# Patient Record
Sex: Female | Born: 1935
Health system: Southern US, Community
[De-identification: ages and names within clinical notes are randomized; demographics above are authoritative.]

## PROBLEM LIST (undated history)

## (undated) DIAGNOSIS — I10 Essential (primary) hypertension: Secondary | ICD-10-CM

## (undated) HISTORY — PX: ABDOMINAL HYSTERECTOMY: SHX81

## (undated) HISTORY — PX: CHOLECYSTECTOMY: SHX55

---

## 2005-10-27 ENCOUNTER — Ambulatory Visit: Payer: Self-pay | Admitting: Unknown Physician Specialty

## 2006-03-27 ENCOUNTER — Ambulatory Visit: Payer: Self-pay | Admitting: Unknown Physician Specialty

## 2006-07-05 ENCOUNTER — Ambulatory Visit: Payer: Self-pay | Admitting: Psychiatry

## 2006-12-06 ENCOUNTER — Ambulatory Visit: Payer: Self-pay | Admitting: Unknown Physician Specialty

## 2006-12-13 ENCOUNTER — Ambulatory Visit: Payer: Self-pay | Admitting: Unknown Physician Specialty

## 2007-12-21 ENCOUNTER — Ambulatory Visit: Payer: Self-pay | Admitting: Unknown Physician Specialty

## 2009-01-12 ENCOUNTER — Ambulatory Visit: Payer: Self-pay | Admitting: Unknown Physician Specialty

## 2010-01-18 ENCOUNTER — Ambulatory Visit: Payer: Self-pay | Admitting: Unknown Physician Specialty

## 2011-05-25 ENCOUNTER — Ambulatory Visit: Payer: Self-pay | Admitting: Unknown Physician Specialty

## 2011-12-16 ENCOUNTER — Emergency Department: Payer: Self-pay | Admitting: Internal Medicine

## 2012-06-06 ENCOUNTER — Observation Stay: Payer: Self-pay | Admitting: Student

## 2012-06-06 LAB — COMPREHENSIVE METABOLIC PANEL
Albumin: 4 g/dL (ref 3.4–5.0)
Bilirubin,Total: 0.5 mg/dL (ref 0.2–1.0)
Calcium, Total: 9.6 mg/dL (ref 8.5–10.1)
Chloride: 103 mmol/L (ref 98–107)
Co2: 30 mmol/L (ref 21–32)
EGFR (African American): >60
EGFR (Non-African Amer.): 56 — ABNORMAL LOW
SGOT(AST): 30 U/L (ref 15–37)
SGPT (ALT): 24 U/L (ref 12–78)
Sodium: 141 mmol/L (ref 136–145)

## 2012-06-06 LAB — CK TOTAL AND CKMB (NOT AT ARMC)
CK, Total: 135 U/L (ref 21–215)
CK-MB: 0.8 ng/mL (ref 0.5–3.6)

## 2012-06-06 LAB — TROPONIN I: Troponin-I: <0.02 ng/mL

## 2012-06-06 LAB — CBC
HCT: 37.5 % (ref 35.0–47.0)
HGB: 12.1 g/dL (ref 12.0–16.0)
MCHC: 32.3 g/dL (ref 32.0–36.0)
MCV: 84 fL (ref 80–100)

## 2012-06-07 LAB — CK TOTAL AND CKMB (NOT AT ARMC)
CK, Total: 102 U/L (ref 21–215)
CK, Total: 89 U/L (ref 21–215)
CK-MB: 0.7 ng/mL (ref 0.5–3.6)

## 2012-06-07 LAB — TROPONIN I
Troponin-I: <0.02 ng/mL
Troponin-I: <0.02 ng/mL

## 2012-12-18 ENCOUNTER — Ambulatory Visit: Payer: Self-pay | Admitting: Unknown Physician Specialty

## 2014-12-23 NOTE — Discharge Summary (Signed)
PATIENT NAME:  [name], [name] DATE OF BIRTH:  06/30/1930  DATE OF ADMISSION:  06/06/2012 DATE OF DISCHARGE:  06/07/2012   PRIMARY CARE PHYSICIAN: Dr. Hyacinth MeekerMiller  CHIEF COMPLAINT: Chest pain.   DISCHARGE DIAGNOSES:  1. Chest pain, likely stress induced and very atypical.  2. Hypertension.  3. Nausea and vomiting in the setting of narcotic use.  4. Gastroesophageal reflux disease. 5. Dementia.  6. Osteoarthritis.   DISCHARGE MEDICATIONS:  1. One A Day 50 Plus 1 tab daily.  2. Hydrochlorothiazide 25 mg daily.  3. Aspirin 81 mg daily.  4. Donepezil 10 mg at bedtime.  5. Omeprazole 20 mg two times a day.  6. Hair, skin, and nails 1 tab daily. 7. Amlodipine 5 mg once a day  8. Calcium 600 with D 600 mg/200 international units 1 tab 2 times a day.   DIET: Low sodium.   ACTIVITY: As tolerated.   FOLLOW-UP: Please follow with your primary care physician within 1 to 2 weeks.   DISPOSITION: Home.   HISTORY OF PRESENT ILLNESS/HOSPITAL COURSE: For full details of the history and physical, please see the dictation on 06/06/2012 by Dr. Cherlynn KaiserSainani. Briefly, this is a pleasant 81 year old African American female who presented with bilateral shoulder pain and then chest pain across her chest while driving. She has been undergoing some stress recently. Here in the ER she did receive some morphine and benzos. She did develop some nausea and vomiting. She was admitted for observation to the hospitalist service. She did not have any acute ST elevations or depressions on the EKG. She underwent cyclic cardiac markers which were all negative and today she has no symptoms of chest pain, shortness of breath, palpitations, and her GI symptoms which likely were in the setting of narcotics she received in the ER have been resolved and she is tolerating a diet. At this point given the very atypical symptoms of the pain and likely relation to anxiety she was experiencing in the setting of her move and  other factors, we would discharge her with outpatient follow-up with primary care physician.   TOTAL TIME SPENT: 35 minutes.   CODE STATUS: The patient is a FULL CODE.   ____________________________ Krystal EatonShayiq Athalene Kolle, MD sa:drc D: 06/07/2012 09:59:15 ET T: 06/07/2012 13:07:29 ET JOB#: [id number]  cc: Krystal EatonShayiq Debby Clyne, MD, <Dictator> Yetta FlockAileen H. Miller, MD Krystal EatonSHAYIQ Luisangel Wainright MD ELECTRONICALLY SIGNED 06/08/2012 15:12

## 2014-12-23 NOTE — H&P (Signed)
PATIENT NAME:  [name], [name] MR#:  [id number] DATE OF BIRTH:  February 19, 1930  DATE OF ADMISSION:  06/06/2012  PRIMARY CARE PHYSICIAN: Silver Huguenin, MD   CHIEF COMPLAINT: Shoulder pain and chest pain.   HISTORY OF PRESENT ILLNESS: The patient is an 81 year old female who presents to the Emergency Room with significant bilateral shoulder and chest pain that began earlier this afternoon while she was driving. She also claims that she developed these two knots on both of her clavicular areas which were quite painful but after putting some ice on it they resolved. She still continued to have some shoulder pain radiating down the left side and, therefore, was a bit concerned and came to the ER for further evaluation. In the Emergency Room, the patient was having some significant pain in her chest and her shoulder area, received a total of 6 mg of morphine with some alleviation of her symptoms. Given the progressive nature of her symptoms and having chest pain, hospitalist service was contacted for treatment and evaluation. The patient did complain of some shortness of breath along with the chest pain but no nausea, no vomiting, no diaphoresis, no palpitations, no syncope, and no dizziness. She has never had these symptoms before. As per the family on further questioning, the patient has been under a significant amount of stress in the past week or so.   REVIEW OF SYSTEMS: CONSTITUTIONAL: No documented fever. No weight gain, no weight loss. EYES: No blurry or double vision. ENT: No tinnitus. No postnasal drip. No redness of the oropharynx. RESPIRATORY: No cough, no wheeze, no hemoptysis. CARDIOVASCULAR: Positive chest pain. No orthopnea, no palpitations, no syncope. GI: No nausea, no vomiting, no diarrhea, no abdominal pain, no melena, no hematochezia. GU: No dysuria, no hematuria. ENDOCRINE: No polyuria or nocturia. No heat or cold intolerance. HEME: No anemia, no bruising, no bleeding. INTEGUMENTARY: No rashes,  no lesions. MUSCULOSKELETAL: No arthritis, no swelling, no gout. NEUROLOGIC: No numbness, no tingling, no ataxia, no seizure-type activity. PSYCH: No anxiety, no insomnia, no ADD.   PAST MEDICAL HISTORY:  1. Dementia. 2. Hypertension. 3. Osteoarthritis. 4. Gastroesophageal reflux disease. 5. Hypertension.   ALLERGIES: No known drug allergies.   SOCIAL HISTORY: No smoking. No alcohol abuse. No illicit drug abuse. Lives at home by herself.   FAMILY HISTORY: The patient cannot recall her family history.   CURRENT MEDICATIONS:  1. Amlodipine 5 mg daily.  2. Aspirin 81 mg daily.  3. Calcium with Vitamin D 1 tab b.i.d.  4. Aricept 10 mg daily.  5. Hydrochlorothiazide 25 mg daily.  6. Omeprazole 20 mg b.i.d.  7. Multivitamin daily.   PHYSICAL EXAMINATION ON ADMISSION:   VITAL SIGNS: Temperature 97.3, pulse 66, respirations 18, blood pressure 114/58, sats 96% on 2 liters nasal cannula.   GENERAL: She is a lethargic appearing female, mildly nauseated.   HEENT: Atraumatic, normocephalic. Extraocular muscles are intact. Pupils are pinpoint but reactive.   NECK: Supple. No jugular venous distention, no bruits, no lymphadenopathy, no thyromegaly. There is no oropharyngeal erythema.   HEART: Regular rate and rhythm. No murmurs, no rubs, no clicks.   LUNGS: Clear to auscultation bilaterally. No rales, no rhonchi, no wheezes.   ABDOMEN: Soft, flat, nontender, nondistended. Has good bowel sounds. No hepatosplenomegaly appreciated.   EXTREMITIES: No evidence of any cyanosis, clubbing, or peripheral edema. Has +2 pedal and radial pulses bilaterally.   NEUROLOGICAL: The patient is alert and oriented x3 with no focal motor or sensory deficits appreciated bilaterally.  SKIN: Moist and warm with no rash appreciated.   LYMPHATIC: There is no cervical or axillary lymphadenopathy.   LABORATORY, DIAGNOSTIC, AND RADIOLOGICAL DATA: Serum glucose 88, BUN 18, creatinine 0.9, sodium 141, potassium  3.3, chloride 103, bicarb 30. LFTs are within normal limits. Troponin less than 0.02. White cell count 12.1, hemoglobin 12.1, hematocrit 37.5, platelet count 206.   The patient did have a chest x-ray done which showed hypoinflated lung fields but no evidence of any acute cardiopulmonary disease.   The patient also underwent a CT scan of the chest done with contrast which showed no evidence of acute pulmonary embolism, mild enlargement of the cardiac chambers without evidence of CHF, emphysematous changes bilaterally, bibasilar atelectasis or early infiltrate. No pulmonary parenchymal masses or nodules appreciated. No mediastinal or hilar lymphadenopathy.   ASSESSMENT AND PLAN: This is an 81 year old female with history of hypertension, gastroesophageal reflux disease, osteoarthritis, and dementia who presents to the hospital with bilateral shoulder and chest pain.  1. Chest pain. The patient has very atypical symptoms for angina. Most likely her symptoms are related to anxiety. I will observe the patient overnight on telemetry. Follow serial cardiac markers. Continue aspirin. P.r.n. nitroglycerin. Avoid giving her narcotics or sedatives as she is quite somnolent presently related to the narcotics and Ativan she got in the ER. The patient's EKG has been reviewed initially which shows normal sinus rhythm with no acute ST or T wave changes.  2. Nausea and vomiting. This is likely related to the narcotics she received in the ER. Therefore, avoid giving her any morphine or Percocet for now. Continue supportive care with antiemetics for now.  3. Gastroesophageal reflux disease. Continue Prilosec.  4. Hypertension. Continue Norvasc and hydrochlorothiazide.  5. Dementia. Continue Aricept.   CODE STATUS: The patient is a FULL CODE.   TIME SPENT: 40 minutes.   ____________________________ Rolly PancakeVivek J. Cherlynn KaiserSainani, MD vjs:drc D: 06/06/2012 19:55:30 ET T: 06/07/2012 07:27:18 ET JOB#: [id number]  cc: Rolly PancakeVivek J.  Cherlynn KaiserSainani, MD, <Dictator> Yetta FlockAileen H. Miller, MD Houston SirenVIVEK J SAINANI MD ELECTRONICALLY SIGNED 06/07/2012 14:21

## 2015-05-29 DIAGNOSIS — K219 Gastro-esophageal reflux disease without esophagitis: Secondary | ICD-10-CM | POA: Diagnosis not present

## 2015-05-29 DIAGNOSIS — M19041 Primary osteoarthritis, right hand: Secondary | ICD-10-CM | POA: Diagnosis not present

## 2015-05-29 DIAGNOSIS — Z23 Encounter for immunization: Secondary | ICD-10-CM | POA: Diagnosis not present

## 2015-05-29 DIAGNOSIS — I10 Essential (primary) hypertension: Secondary | ICD-10-CM | POA: Diagnosis not present

## 2015-05-29 DIAGNOSIS — M160 Bilateral primary osteoarthritis of hip: Secondary | ICD-10-CM | POA: Diagnosis not present

## 2015-05-29 DIAGNOSIS — M19042 Primary osteoarthritis, left hand: Secondary | ICD-10-CM | POA: Diagnosis not present

## 2015-06-11 DIAGNOSIS — Z961 Presence of intraocular lens: Secondary | ICD-10-CM | POA: Diagnosis not present

## 2015-08-27 DIAGNOSIS — G301 Alzheimer's disease with late onset: Secondary | ICD-10-CM | POA: Diagnosis not present

## 2015-08-27 DIAGNOSIS — Z6841 Body Mass Index (BMI) 40.0 and over, adult: Secondary | ICD-10-CM | POA: Diagnosis not present

## 2015-08-27 DIAGNOSIS — I10 Essential (primary) hypertension: Secondary | ICD-10-CM | POA: Diagnosis not present

## 2015-08-27 DIAGNOSIS — M170 Bilateral primary osteoarthritis of knee: Secondary | ICD-10-CM | POA: Diagnosis not present

## 2015-08-27 DIAGNOSIS — K219 Gastro-esophageal reflux disease without esophagitis: Secondary | ICD-10-CM | POA: Diagnosis not present

## 2015-08-27 DIAGNOSIS — M79672 Pain in left foot: Secondary | ICD-10-CM | POA: Diagnosis not present

## 2015-08-27 DIAGNOSIS — F0280 Dementia in other diseases classified elsewhere without behavioral disturbance: Secondary | ICD-10-CM | POA: Diagnosis not present

## 2015-09-30 DIAGNOSIS — M2042 Other hammer toe(s) (acquired), left foot: Secondary | ICD-10-CM | POA: Diagnosis not present

## 2015-09-30 DIAGNOSIS — M898X9 Other specified disorders of bone, unspecified site: Secondary | ICD-10-CM | POA: Diagnosis not present

## 2015-11-07 ENCOUNTER — Encounter: Payer: Self-pay | Admitting: *Deleted

## 2015-11-07 ENCOUNTER — Ambulatory Visit
Admission: EM | Admit: 2015-11-07 | Discharge: 2015-11-07 | Disposition: A | Discharge status: Home / Self Care | Payer: Commercial Managed Care - HMO | Attending: Family Medicine | Admitting: Family Medicine

## 2015-11-07 ENCOUNTER — Ambulatory Visit (INDEPENDENT_AMBULATORY_CARE_PROVIDER_SITE_OTHER): Payer: Commercial Managed Care - HMO

## 2015-11-07 DIAGNOSIS — M10069 Idiopathic gout, unspecified knee: Secondary | ICD-10-CM

## 2015-11-07 DIAGNOSIS — M109 Gout, unspecified: Secondary | ICD-10-CM

## 2015-11-07 DIAGNOSIS — M179 Osteoarthritis of knee, unspecified: Secondary | ICD-10-CM | POA: Diagnosis not present

## 2015-11-07 HISTORY — DX: Essential (primary) hypertension: I10

## 2015-11-07 LAB — URIC ACID: Uric Acid, Serum: 7.6 mg/dL — ABNORMAL HIGH (ref 2.3–6.6)

## 2015-11-07 MED ORDER — INDOMETHACIN 25 MG PO CAPS: 25.0000 mg | ORAL_CAPSULE | Freq: Three times a day (TID) | ORAL | Status: DC | PRN

## 2015-11-07 NOTE — ED Provider Notes (Signed)
CSN: [id number]     Arrival date & time 11/07/15  1043 History   First MD Initiated Contact with Patient 11/07/15 1156     Chief Complaint  Patient presents with  . Knee Pain   (Consider location/radiation/quality/duration/timing/severity/associated sxs/prior Treatment) HPI: Patient presents today with symptoms of left knee pain. Patient states that her symptoms started 4 days ago. She denies any trauma or injury to the knee. She does have some history of arthritis in her hands but has not had any symptoms in her knees before. She denies any clicking or locking of the knee. She denies any fevers. She denies any history of gout. She denies any recent alcohol use or eating any red meat. She has eaten fish. She is on hydrochlorothiazide which she has been on for years.  Past Medical History  Diagnosis Date  . Hypertension    Past Surgical History  Procedure Laterality Date  . Abdominal hysterectomy    . Cholecystectomy     No family history on file. Social History  Substance Use Topics  . Smoking status: Never Smoker   . Smokeless tobacco: None  . Alcohol Use: No   OB History    No data available     Review of Systems: Negative except mentioned above.   Allergies  Review of patient's allergies indicates no known allergies.  Home Medications   Prior to Admission medications   Medication Sig Start Date End Date Taking? Authorizing Provider  donepezil (ARICEPT) 10 MG tablet Take 10 mg by mouth at bedtime.   Yes Historical Provider, MD  hydrochlorothiazide (HYDRODIURIL) 25 MG tablet Take 25 mg by mouth daily.   Yes Historical Provider, MD  Multiple Vitamin (MULTIVITAMIN) capsule Take 1 capsule by mouth daily.   Yes Historical Provider, MD  omeprazole (PRILOSEC) 20 MG capsule Take 20 mg by mouth 2 (two) times daily before a meal.   Yes Historical Provider, MD  potassium chloride (K-DUR) 10 MEQ tablet Take 20 mEq by mouth 2 (two) times daily.    Yes Historical Provider, MD   Meds  Ordered and Administered this Visit  Medications - No data to display  BP 143/58 mmHg  Pulse 61  Temp(Src) 96.9 F (36.1 C) (Tympanic)  Ht 5\' 5"  (1.651 m)  Wt 270 lb (122.471 kg)  BMI 44.93 kg/m2  SpO2 98% No data found.   Physical Exam   GENERAL: NAD RESP: CTA B CARD: RRR MSK: L Knee: mild warmth, no erythema, ROM limited due to pain, no instability appreciated, no medial or lateral jointline tenderness, -Homans  NEURO: CN II-XII grossly intact   ED Course  Procedures (including critical care time)  Labs Review Labs Reviewed - No data to display  Imaging Review No results found.     MDM   A/P: Left Knee Pain- likely related to gout, uric acid slightly elevated, x-rays show degenerative changes, will prescribe patient Indocin, encourage patient to review handout regarding gout that was given to patient, I would recommend that patient follow up with her primary care physician regarding this. Would not stop hydrochlorothiazide at this time since this is the first gout attack for the patient. Encouraged staying hydrated. If frequent bouts may want to consider. Seek medical attention if any further issues.  Jolene ProvostKirtida Evann Koelzer, MD 11/07/15 1310

## 2015-11-07 NOTE — ED Notes (Signed)
Pt states that she has left knee pain, no known injury, started 4 days ago.

## 2015-12-02 DIAGNOSIS — F0280 Dementia in other diseases classified elsewhere without behavioral disturbance: Secondary | ICD-10-CM | POA: Diagnosis not present

## 2015-12-02 DIAGNOSIS — I10 Essential (primary) hypertension: Secondary | ICD-10-CM | POA: Diagnosis not present

## 2015-12-02 DIAGNOSIS — M1712 Unilateral primary osteoarthritis, left knee: Secondary | ICD-10-CM | POA: Diagnosis not present

## 2015-12-02 DIAGNOSIS — R220 Localized swelling, mass and lump, head: Secondary | ICD-10-CM | POA: Diagnosis not present

## 2015-12-02 DIAGNOSIS — G301 Alzheimer's disease with late onset: Secondary | ICD-10-CM | POA: Diagnosis not present

## 2016-01-27 DIAGNOSIS — M7989 Other specified soft tissue disorders: Secondary | ICD-10-CM | POA: Diagnosis not present

## 2016-01-27 DIAGNOSIS — M79643 Pain in unspecified hand: Secondary | ICD-10-CM | POA: Diagnosis not present

## 2016-01-27 DIAGNOSIS — G301 Alzheimer's disease with late onset: Secondary | ICD-10-CM | POA: Diagnosis not present

## 2016-01-27 DIAGNOSIS — F0280 Dementia in other diseases classified elsewhere without behavioral disturbance: Secondary | ICD-10-CM | POA: Diagnosis not present

## 2016-01-27 DIAGNOSIS — M79645 Pain in left finger(s): Secondary | ICD-10-CM | POA: Diagnosis not present

## 2016-01-27 DIAGNOSIS — I10 Essential (primary) hypertension: Secondary | ICD-10-CM | POA: Diagnosis not present

## 2016-01-27 DIAGNOSIS — Z6841 Body Mass Index (BMI) 40.0 and over, adult: Secondary | ICD-10-CM | POA: Diagnosis not present

## 2016-03-11 DIAGNOSIS — I10 Essential (primary) hypertension: Secondary | ICD-10-CM | POA: Diagnosis not present

## 2016-03-11 DIAGNOSIS — M25562 Pain in left knee: Secondary | ICD-10-CM | POA: Diagnosis not present

## 2016-03-11 DIAGNOSIS — M25472 Effusion, left ankle: Secondary | ICD-10-CM | POA: Diagnosis not present

## 2016-03-11 DIAGNOSIS — Z131 Encounter for screening for diabetes mellitus: Secondary | ICD-10-CM | POA: Diagnosis not present

## 2016-03-11 DIAGNOSIS — M179 Osteoarthritis of knee, unspecified: Secondary | ICD-10-CM | POA: Diagnosis not present

## 2016-03-11 DIAGNOSIS — M7989 Other specified soft tissue disorders: Secondary | ICD-10-CM | POA: Diagnosis not present

## 2016-06-01 DIAGNOSIS — M1000 Idiopathic gout, unspecified site: Secondary | ICD-10-CM | POA: Diagnosis not present

## 2016-06-01 DIAGNOSIS — M1712 Unilateral primary osteoarthritis, left knee: Secondary | ICD-10-CM | POA: Diagnosis not present

## 2016-06-01 DIAGNOSIS — Z23 Encounter for immunization: Secondary | ICD-10-CM | POA: Diagnosis not present

## 2017-01-17 DIAGNOSIS — Z131 Encounter for screening for diabetes mellitus: Secondary | ICD-10-CM | POA: Diagnosis not present

## 2017-01-17 DIAGNOSIS — F0280 Dementia in other diseases classified elsewhere without behavioral disturbance: Secondary | ICD-10-CM | POA: Diagnosis not present

## 2017-01-17 DIAGNOSIS — K219 Gastro-esophageal reflux disease without esophagitis: Secondary | ICD-10-CM | POA: Diagnosis not present

## 2017-01-17 DIAGNOSIS — I10 Essential (primary) hypertension: Secondary | ICD-10-CM | POA: Diagnosis not present

## 2017-01-17 DIAGNOSIS — M1000 Idiopathic gout, unspecified site: Secondary | ICD-10-CM | POA: Diagnosis not present

## 2017-01-17 DIAGNOSIS — G301 Alzheimer's disease with late onset: Secondary | ICD-10-CM | POA: Diagnosis not present

## 2017-04-19 DIAGNOSIS — Z131 Encounter for screening for diabetes mellitus: Secondary | ICD-10-CM | POA: Diagnosis not present

## 2017-04-19 DIAGNOSIS — M1000 Idiopathic gout, unspecified site: Secondary | ICD-10-CM | POA: Diagnosis not present

## 2017-04-19 DIAGNOSIS — I10 Essential (primary) hypertension: Secondary | ICD-10-CM | POA: Diagnosis not present

## 2017-08-22 DIAGNOSIS — Z780 Asymptomatic menopausal state: Secondary | ICD-10-CM | POA: Diagnosis not present

## 2017-08-22 DIAGNOSIS — Z0000 Encounter for general adult medical examination without abnormal findings: Secondary | ICD-10-CM | POA: Diagnosis not present

## 2017-08-22 DIAGNOSIS — Z1231 Encounter for screening mammogram for malignant neoplasm of breast: Secondary | ICD-10-CM | POA: Diagnosis not present

## 2017-08-22 DIAGNOSIS — Z23 Encounter for immunization: Secondary | ICD-10-CM | POA: Diagnosis not present

## 2017-08-22 DIAGNOSIS — I10 Essential (primary) hypertension: Secondary | ICD-10-CM | POA: Diagnosis not present

## 2017-09-21 DIAGNOSIS — Z780 Asymptomatic menopausal state: Secondary | ICD-10-CM | POA: Diagnosis not present

## 2017-11-23 DIAGNOSIS — K219 Gastro-esophageal reflux disease without esophagitis: Secondary | ICD-10-CM | POA: Diagnosis not present

## 2017-11-23 DIAGNOSIS — F0280 Dementia in other diseases classified elsewhere without behavioral disturbance: Secondary | ICD-10-CM | POA: Diagnosis not present

## 2017-11-23 DIAGNOSIS — F411 Generalized anxiety disorder: Secondary | ICD-10-CM | POA: Diagnosis not present

## 2017-11-23 DIAGNOSIS — I10 Essential (primary) hypertension: Secondary | ICD-10-CM | POA: Diagnosis not present

## 2017-11-23 DIAGNOSIS — M1000 Idiopathic gout, unspecified site: Secondary | ICD-10-CM | POA: Diagnosis not present

## 2017-11-23 DIAGNOSIS — G301 Alzheimer's disease with late onset: Secondary | ICD-10-CM | POA: Diagnosis not present

## 2018-01-18 DIAGNOSIS — L200 Besnier's prurigo: Secondary | ICD-10-CM | POA: Diagnosis not present

## 2018-03-26 DIAGNOSIS — I10 Essential (primary) hypertension: Secondary | ICD-10-CM | POA: Diagnosis not present

## 2018-03-26 DIAGNOSIS — M542 Cervicalgia: Secondary | ICD-10-CM | POA: Diagnosis not present

## 2018-03-26 DIAGNOSIS — Z6841 Body Mass Index (BMI) 40.0 and over, adult: Secondary | ICD-10-CM | POA: Diagnosis not present

## 2018-03-26 DIAGNOSIS — M1000 Idiopathic gout, unspecified site: Secondary | ICD-10-CM | POA: Diagnosis not present

## 2018-05-21 DIAGNOSIS — R05 Cough: Secondary | ICD-10-CM | POA: Diagnosis not present

## 2018-06-20 DIAGNOSIS — I10 Essential (primary) hypertension: Secondary | ICD-10-CM | POA: Diagnosis not present

## 2018-06-20 DIAGNOSIS — M1000 Idiopathic gout, unspecified site: Secondary | ICD-10-CM | POA: Diagnosis not present

## 2018-06-27 DIAGNOSIS — F0280 Dementia in other diseases classified elsewhere without behavioral disturbance: Secondary | ICD-10-CM | POA: Diagnosis not present

## 2018-06-27 DIAGNOSIS — Z23 Encounter for immunization: Secondary | ICD-10-CM | POA: Diagnosis not present

## 2018-06-27 DIAGNOSIS — G301 Alzheimer's disease with late onset: Secondary | ICD-10-CM | POA: Diagnosis not present

## 2018-06-27 DIAGNOSIS — Z0000 Encounter for general adult medical examination without abnormal findings: Secondary | ICD-10-CM | POA: Diagnosis not present

## 2018-06-27 DIAGNOSIS — L299 Pruritus, unspecified: Secondary | ICD-10-CM | POA: Diagnosis not present

## 2018-09-02 ENCOUNTER — Encounter: Payer: Self-pay | Admitting: Emergency Medicine

## 2018-09-02 ENCOUNTER — Other Ambulatory Visit: Payer: Self-pay

## 2018-09-02 ENCOUNTER — Ambulatory Visit
Admission: EM | Admit: 2018-09-02 | Discharge: 2018-09-02 | Disposition: A | Discharge status: Home / Self Care | Payer: Medicare HMO | Attending: Student | Admitting: Student

## 2018-09-02 DIAGNOSIS — R1084 Generalized abdominal pain: Secondary | ICD-10-CM | POA: Insufficient documentation

## 2018-09-02 MED ORDER — CELECOXIB 50 MG PO CAPS: 50.0000 mg | ORAL_CAPSULE | Freq: Two times a day (BID) | ORAL | 0 refills | Status: DC

## 2018-09-02 MED ORDER — SUCRALFATE 1 G PO TABS: 1.0000 g | ORAL_TABLET | Freq: Two times a day (BID) | ORAL | 0 refills | Status: DC

## 2018-09-02 NOTE — ED Triage Notes (Signed)
Patient c/o lower abdominal/groin pain that started 3 weeks ago. She states anything she has to do that causes her to strain is painful. Denies fever, nausea, vomiting and diarrhea. Denies urinary symptoms. She states she has had a hernia before.

## 2018-09-02 NOTE — Discharge Instructions (Signed)
-  Take medications as directed. -Follow-up with PCP to discuss issue if pain continues.

## 2018-09-03 NOTE — ED Provider Notes (Signed)
[facility]    CSN: [id number] Arrival date & time: 09/02/18  1300     History   Chief Complaint Chief Complaint  Patient presents with  . Abdominal Pain    HPI [name] is a 84 y.o. female who presents today for evaluation of abdominal pain ongoing for 3 weeks.  The patient denies any injury or trauma, she states that any activity that causes her to put a "strain" on her abdomen causing her discomfort.  She states that when she gets up from a sitting to standing position she experiences pain.  When she is lying down and not moving the pain resolves.  She does not think that any food cause her discomfort, she cannot tell me if eating hurts or helps the discomfort.  She is concerned that she might have developed an ulcer, she also states that in the past she has been diagnosed with a hernia.  Denies any nausea, vomiting or diarrhea.  No bloody stools.  HPI  Past Medical History:  Diagnosis Date  . Hypertension     There are no active problems to display for this patient.   Past Surgical History:  Procedure Laterality Date  . ABDOMINAL HYSTERECTOMY    . CHOLECYSTECTOMY      OB History   No obstetric history on file.      Home Medications    Prior to Admission medications   Medication Sig Start Date End Date Taking? Authorizing Provider  donepezil (ARICEPT) 10 MG tablet Take 10 mg by mouth at bedtime.   Yes [provider]  hydrochlorothiazide (HYDRODIURIL) 25 MG tablet Take 25 mg by mouth daily.   Yes [provider]  indomethacin (INDOCIN) 25 MG capsule Take 1 capsule (25 mg total) by mouth 3 (three) times daily as needed. 11/07/15  Yes Jolene Provost, MD  Multiple Vitamin (MULTIVITAMIN) capsule Take 1 capsule by mouth daily.   Yes [provider]  omeprazole (PRILOSEC) 20 MG capsule Take 20 mg by mouth 2 (two) times daily before a meal.   Yes [provider]  potassium chloride (K-DUR) 10 MEQ tablet Take 20 mEq  by mouth 2 (two) times daily.    Yes [provider]  celecoxib (CELEBREX) 50 MG capsule Take 1 capsule (50 mg total) by mouth 2 (two) times daily. 09/02/18   Anson Oregon, PA-C  sucralfate (CARAFATE) 1 g tablet Take 1 tablet (1 g total) by mouth 2 (two) times daily. 09/02/18   Anson [place], PA-C    Family History History reviewed. No pertinent family history.  Social History Social History   Tobacco Use  . Smoking status: Never Smoker  . Smokeless tobacco: Never Used  Substance Use Topics  . Alcohol use: No  . Drug use: No     Allergies   Ace inhibitors   Review of Systems Review of Systems  Constitutional: Negative for appetite change and fever.  HENT: Negative.   Eyes: Negative.   Respiratory: Negative.   Cardiovascular: Negative.   Gastrointestinal: Positive for abdominal pain. Negative for blood in stool, diarrhea, nausea, rectal pain and vomiting.  All other systems reviewed and are negative.  Physical Exam Triage Vital Signs ED Triage Vitals  Enc Vitals Group     BP 09/02/18 1419 (!) 180/81     Pulse Rate 09/02/18 1419 (!) 57     Resp 09/02/18 1419 18     Temp 09/02/18 1419 97.8 F (36.6 C)     Temp  Source 09/02/18 1419 Oral     SpO2 09/02/18 1419 98 %     Weight 09/02/18 1417 250 lb (113.4 kg)     Height 09/02/18 1417 5\' 4"  (1.626 m)     Head Circumference --      Peak Flow --      Pain Score 09/02/18 1416 9     Pain Loc --      Pain Edu? --      Excl. in GC? --    No data found.  Updated Vital Signs BP (!) 180/81 (BP Location: Right Arm)   Pulse (!) 57   Temp 97.8 F (36.6 C) (Oral)   Resp 18   Ht 5\' 4"  (1.626 m)   Wt 250 lb (113.4 kg)   SpO2 98%   BMI 42.91 kg/m   Visual Acuity Right Eye Distance:   Left Eye Distance:   Bilateral Distance:    Right Eye Near:   Left Eye Near:    Bilateral Near:     Physical Exam Constitutional:      General: She is not in acute distress.    Appearance: She is not  ill-appearing.  Cardiovascular:     Rate and Rhythm: Normal rate and regular rhythm.     Heart sounds: No murmur. No friction rub. No gallop.   Pulmonary:     Effort: Pulmonary effort is normal.     Breath sounds: Normal breath sounds. No stridor. No wheezing or rhonchi.  Abdominal:     General: Bowel sounds are normal. There is no distension or abdominal bruit.     Palpations: Abdomen is soft. There is no shifting dullness, fluid wave, hepatomegaly, splenomegaly or mass.     Tenderness: There is abdominal tenderness in the left upper quadrant. There is no right CVA tenderness or rebound. Negative signs include Murphy's sign, McBurney's sign and obturator sign.     Comments: Due to the body habitus I was unable to palpate a hernia.  Neurological:     Mental Status: She is alert.    UC Treatments / Results  Labs (all labs ordered are listed, but only abnormal results are displayed) Labs Reviewed - No data to display  EKG None  Radiology No results found.  Procedures Procedures (including critical care time)  Medications Ordered in UC Medications - No data to display  Initial Impression / Assessment and Plan / UC Course  I have reviewed the triage vital signs and the nursing notes.  Pertinent labs & imaging results that were available during my care of the patient were reviewed by me and considered in my medical decision making (see chart for details).     1.  Options were discussed today with the patient. 2.  The way that the patient describes her pain it does sound muscular in nature due to the increased discomfort with mobility and activities.  Due to the patient's body habitus I was unable to palpate hernia.  The patient was given a prescription of Celebrex to take for discomfort. 3.  The patient is concerned that she may have an ulcer, instructed the patient that her symptoms do not match what I anticipate a patient presenting with ulcer.  She states that she would like  to be prescribed something that would help if she does indeed have an ulcer, she was given a one-time supply of Carafate.  Instructed to follow-up with her primary care physician to discuss continued discomfort. 4.  Follow-up if symptoms worsen or  fail to improve. Final Clinical Impressions(s) / UC Diagnoses   Final diagnoses:  Generalized abdominal pain     Discharge Instructions     -Take medications as directed. -Follow-up with PCP to discuss issue if pain continues.    ED Prescriptions    Medication Sig Dispense Auth. Provider   sucralfate (CARAFATE) 1 g tablet Take 1 tablet (1 g total) by mouth 2 (two) times daily. 60 tablet Anson OregonMcGhee, Rayquon Uselman Lance, [place]PA-C   celecoxib (CELEBREX) 50 MG capsule Take 1 capsule (50 mg total) by mouth 2 (two) times daily. 60 capsule Anson OregonMcGhee, Damier Disano Lance, PA-C     Controlled Substance Prescriptions [place] Controlled Substance Registry consulted? Not Applicable   Anson OregonMcGhee, Yvett Rossel Lance, PA-C 09/03/18 1010

## 2018-09-28 ENCOUNTER — Ambulatory Visit
Admission: RE | Admit: 2018-09-28 | Discharge: 2018-09-28 | Disposition: A | Discharge status: Home / Self Care | Payer: Medicare HMO | Source: Ambulatory Visit | Attending: Internal Medicine | Admitting: Internal Medicine

## 2018-09-28 ENCOUNTER — Other Ambulatory Visit: Payer: Self-pay | Admitting: Internal Medicine

## 2018-09-28 DIAGNOSIS — K449 Diaphragmatic hernia without obstruction or gangrene: Secondary | ICD-10-CM | POA: Diagnosis not present

## 2018-09-28 DIAGNOSIS — R197 Diarrhea, unspecified: Secondary | ICD-10-CM | POA: Diagnosis not present

## 2018-09-28 DIAGNOSIS — R1032 Left lower quadrant pain: Secondary | ICD-10-CM | POA: Diagnosis not present

## 2018-09-28 DIAGNOSIS — L304 Erythema intertrigo: Secondary | ICD-10-CM | POA: Diagnosis not present

## 2018-09-28 DIAGNOSIS — R1013 Epigastric pain: Secondary | ICD-10-CM | POA: Diagnosis not present

## 2018-09-28 MED ORDER — IOHEXOL 300 MG/ML  SOLN
100.0000 mL | Freq: Once | INTRAMUSCULAR | Status: AC | PRN
  Administered 2018-09-28: 100 mL via INTRAVENOUS

## 2018-10-03 DIAGNOSIS — R197 Diarrhea, unspecified: Secondary | ICD-10-CM | POA: Diagnosis not present

## 2018-10-08 DIAGNOSIS — R1032 Left lower quadrant pain: Secondary | ICD-10-CM | POA: Diagnosis not present

## 2018-11-07 DIAGNOSIS — I10 Essential (primary) hypertension: Secondary | ICD-10-CM | POA: Diagnosis not present

## 2018-11-07 DIAGNOSIS — R1030 Lower abdominal pain, unspecified: Secondary | ICD-10-CM | POA: Diagnosis not present

## 2019-03-14 DIAGNOSIS — L304 Erythema intertrigo: Secondary | ICD-10-CM | POA: Diagnosis not present

## 2019-03-14 DIAGNOSIS — Z6841 Body Mass Index (BMI) 40.0 and over, adult: Secondary | ICD-10-CM | POA: Diagnosis not present

## 2019-03-14 DIAGNOSIS — Z7689 Persons encountering health services in other specified circumstances: Secondary | ICD-10-CM | POA: Diagnosis not present

## 2019-03-14 DIAGNOSIS — K219 Gastro-esophageal reflux disease without esophagitis: Secondary | ICD-10-CM | POA: Diagnosis not present

## 2019-03-14 DIAGNOSIS — G301 Alzheimer's disease with late onset: Secondary | ICD-10-CM | POA: Diagnosis not present

## 2019-03-14 DIAGNOSIS — I10 Essential (primary) hypertension: Secondary | ICD-10-CM | POA: Diagnosis not present

## 2019-03-14 DIAGNOSIS — M159 Polyosteoarthritis, unspecified: Secondary | ICD-10-CM | POA: Diagnosis not present

## 2019-03-14 DIAGNOSIS — Z0000 Encounter for general adult medical examination without abnormal findings: Secondary | ICD-10-CM | POA: Diagnosis not present

## 2019-03-14 DIAGNOSIS — D649 Anemia, unspecified: Secondary | ICD-10-CM | POA: Diagnosis not present

## 2019-05-02 DIAGNOSIS — R69 Illness, unspecified: Secondary | ICD-10-CM | POA: Diagnosis not present

## 2019-07-10 DIAGNOSIS — G301 Alzheimer's disease with late onset: Secondary | ICD-10-CM | POA: Diagnosis not present

## 2019-07-10 DIAGNOSIS — R7309 Other abnormal glucose: Secondary | ICD-10-CM | POA: Diagnosis not present

## 2019-07-10 DIAGNOSIS — E538 Deficiency of other specified B group vitamins: Secondary | ICD-10-CM | POA: Diagnosis not present

## 2019-07-10 DIAGNOSIS — K219 Gastro-esophageal reflux disease without esophagitis: Secondary | ICD-10-CM | POA: Diagnosis not present

## 2019-07-10 DIAGNOSIS — L304 Erythema intertrigo: Secondary | ICD-10-CM | POA: Diagnosis not present

## 2019-07-10 DIAGNOSIS — M159 Polyosteoarthritis, unspecified: Secondary | ICD-10-CM | POA: Diagnosis not present

## 2019-07-10 DIAGNOSIS — I10 Essential (primary) hypertension: Secondary | ICD-10-CM | POA: Diagnosis not present

## 2019-07-10 DIAGNOSIS — Z6841 Body Mass Index (BMI) 40.0 and over, adult: Secondary | ICD-10-CM | POA: Diagnosis not present

## 2019-07-10 DIAGNOSIS — D649 Anemia, unspecified: Secondary | ICD-10-CM | POA: Diagnosis not present

## 2019-07-17 DIAGNOSIS — Z79899 Other long term (current) drug therapy: Secondary | ICD-10-CM | POA: Diagnosis not present

## 2019-07-17 DIAGNOSIS — Z8739 Personal history of other diseases of the musculoskeletal system and connective tissue: Secondary | ICD-10-CM | POA: Diagnosis not present

## 2019-07-17 DIAGNOSIS — I10 Essential (primary) hypertension: Secondary | ICD-10-CM | POA: Diagnosis not present

## 2019-07-17 DIAGNOSIS — M8949 Other hypertrophic osteoarthropathy, multiple sites: Secondary | ICD-10-CM | POA: Diagnosis not present

## 2019-07-17 DIAGNOSIS — R1030 Lower abdominal pain, unspecified: Secondary | ICD-10-CM | POA: Diagnosis not present

## 2019-08-06 DIAGNOSIS — Z23 Encounter for immunization: Secondary | ICD-10-CM | POA: Diagnosis not present

## 2019-11-07 DIAGNOSIS — R1030 Lower abdominal pain, unspecified: Secondary | ICD-10-CM | POA: Diagnosis not present

## 2019-11-07 DIAGNOSIS — Z8739 Personal history of other diseases of the musculoskeletal system and connective tissue: Secondary | ICD-10-CM | POA: Diagnosis not present

## 2019-11-07 DIAGNOSIS — Z6839 Body mass index (BMI) 39.0-39.9, adult: Secondary | ICD-10-CM | POA: Diagnosis not present

## 2019-11-07 DIAGNOSIS — M8949 Other hypertrophic osteoarthropathy, multiple sites: Secondary | ICD-10-CM | POA: Diagnosis not present

## 2019-11-07 DIAGNOSIS — I10 Essential (primary) hypertension: Secondary | ICD-10-CM | POA: Diagnosis not present

## 2019-11-14 DIAGNOSIS — M170 Bilateral primary osteoarthritis of knee: Secondary | ICD-10-CM | POA: Diagnosis not present

## 2019-11-14 DIAGNOSIS — R600 Localized edema: Secondary | ICD-10-CM | POA: Diagnosis not present

## 2019-11-14 DIAGNOSIS — Z6841 Body Mass Index (BMI) 40.0 and over, adult: Secondary | ICD-10-CM | POA: Diagnosis not present

## 2019-11-14 DIAGNOSIS — I10 Essential (primary) hypertension: Secondary | ICD-10-CM | POA: Diagnosis not present

## 2019-11-14 DIAGNOSIS — K219 Gastro-esophageal reflux disease without esophagitis: Secondary | ICD-10-CM | POA: Diagnosis not present

## 2019-11-14 DIAGNOSIS — Z0000 Encounter for general adult medical examination without abnormal findings: Secondary | ICD-10-CM | POA: Diagnosis not present

## 2019-11-14 DIAGNOSIS — F0150 Vascular dementia without behavioral disturbance: Secondary | ICD-10-CM | POA: Diagnosis not present

## 2019-11-14 DIAGNOSIS — M160 Bilateral primary osteoarthritis of hip: Secondary | ICD-10-CM | POA: Diagnosis not present

## 2019-11-14 DIAGNOSIS — M109 Gout, unspecified: Secondary | ICD-10-CM | POA: Diagnosis not present

## 2019-12-15 DIAGNOSIS — M169 Osteoarthritis of hip, unspecified: Secondary | ICD-10-CM | POA: Diagnosis not present

## 2019-12-15 DIAGNOSIS — R262 Difficulty in walking, not elsewhere classified: Secondary | ICD-10-CM | POA: Diagnosis not present

## 2020-03-13 DIAGNOSIS — F0280 Dementia in other diseases classified elsewhere without behavioral disturbance: Secondary | ICD-10-CM | POA: Diagnosis not present

## 2020-03-13 DIAGNOSIS — Z6841 Body Mass Index (BMI) 40.0 and over, adult: Secondary | ICD-10-CM | POA: Diagnosis not present

## 2020-03-13 DIAGNOSIS — R1030 Lower abdominal pain, unspecified: Secondary | ICD-10-CM | POA: Diagnosis not present

## 2020-03-13 DIAGNOSIS — I10 Essential (primary) hypertension: Secondary | ICD-10-CM | POA: Diagnosis not present

## 2020-03-13 DIAGNOSIS — M160 Bilateral primary osteoarthritis of hip: Secondary | ICD-10-CM | POA: Diagnosis not present

## 2020-03-13 DIAGNOSIS — G301 Alzheimer's disease with late onset: Secondary | ICD-10-CM | POA: Diagnosis not present

## 2020-03-13 DIAGNOSIS — K219 Gastro-esophageal reflux disease without esophagitis: Secondary | ICD-10-CM | POA: Diagnosis not present

## 2020-03-13 DIAGNOSIS — L304 Erythema intertrigo: Secondary | ICD-10-CM | POA: Diagnosis not present

## 2020-03-13 DIAGNOSIS — M109 Gout, unspecified: Secondary | ICD-10-CM | POA: Diagnosis not present

## 2020-03-20 DIAGNOSIS — Z79899 Other long term (current) drug therapy: Secondary | ICD-10-CM | POA: Diagnosis not present

## 2020-03-20 DIAGNOSIS — Z0000 Encounter for general adult medical examination without abnormal findings: Secondary | ICD-10-CM | POA: Diagnosis not present

## 2020-03-20 DIAGNOSIS — I10 Essential (primary) hypertension: Secondary | ICD-10-CM | POA: Diagnosis not present

## 2020-03-20 DIAGNOSIS — F0150 Vascular dementia without behavioral disturbance: Secondary | ICD-10-CM | POA: Diagnosis not present

## 2020-03-20 DIAGNOSIS — D649 Anemia, unspecified: Secondary | ICD-10-CM | POA: Diagnosis not present

## 2020-06-04 DIAGNOSIS — R195 Other fecal abnormalities: Secondary | ICD-10-CM | POA: Diagnosis not present

## 2020-06-04 DIAGNOSIS — Z23 Encounter for immunization: Secondary | ICD-10-CM | POA: Diagnosis not present

## 2020-06-04 DIAGNOSIS — I739 Peripheral vascular disease, unspecified: Secondary | ICD-10-CM | POA: Diagnosis not present

## 2020-06-04 DIAGNOSIS — K219 Gastro-esophageal reflux disease without esophagitis: Secondary | ICD-10-CM | POA: Diagnosis not present

## 2020-06-04 DIAGNOSIS — M1611 Unilateral primary osteoarthritis, right hip: Secondary | ICD-10-CM | POA: Diagnosis not present

## 2020-06-04 DIAGNOSIS — I10 Essential (primary) hypertension: Secondary | ICD-10-CM | POA: Diagnosis not present

## 2020-06-04 DIAGNOSIS — F0150 Vascular dementia without behavioral disturbance: Secondary | ICD-10-CM | POA: Diagnosis not present

## 2020-06-04 DIAGNOSIS — R1031 Right lower quadrant pain: Secondary | ICD-10-CM | POA: Diagnosis not present

## 2020-06-04 DIAGNOSIS — M47816 Spondylosis without myelopathy or radiculopathy, lumbar region: Secondary | ICD-10-CM | POA: Diagnosis not present

## 2020-06-04 DIAGNOSIS — M109 Gout, unspecified: Secondary | ICD-10-CM | POA: Diagnosis not present

## 2020-09-28 DIAGNOSIS — M792 Neuralgia and neuritis, unspecified: Secondary | ICD-10-CM | POA: Diagnosis not present

## 2020-09-28 DIAGNOSIS — R1031 Right lower quadrant pain: Secondary | ICD-10-CM | POA: Diagnosis not present

## 2020-09-28 DIAGNOSIS — G301 Alzheimer's disease with late onset: Secondary | ICD-10-CM | POA: Diagnosis not present

## 2020-09-28 DIAGNOSIS — I10 Essential (primary) hypertension: Secondary | ICD-10-CM | POA: Diagnosis not present

## 2020-09-28 DIAGNOSIS — M109 Gout, unspecified: Secondary | ICD-10-CM | POA: Diagnosis not present

## 2020-09-28 DIAGNOSIS — F0280 Dementia in other diseases classified elsewhere without behavioral disturbance: Secondary | ICD-10-CM | POA: Diagnosis not present

## 2020-09-28 DIAGNOSIS — K219 Gastro-esophageal reflux disease without esophagitis: Secondary | ICD-10-CM | POA: Diagnosis not present

## 2020-09-28 DIAGNOSIS — R195 Other fecal abnormalities: Secondary | ICD-10-CM | POA: Diagnosis not present

## 2020-10-05 DIAGNOSIS — R195 Other fecal abnormalities: Secondary | ICD-10-CM | POA: Diagnosis not present

## 2020-10-05 DIAGNOSIS — Z79899 Other long term (current) drug therapy: Secondary | ICD-10-CM | POA: Diagnosis not present

## 2020-10-05 DIAGNOSIS — F0150 Vascular dementia without behavioral disturbance: Secondary | ICD-10-CM | POA: Diagnosis not present

## 2020-10-05 DIAGNOSIS — M160 Bilateral primary osteoarthritis of hip: Secondary | ICD-10-CM | POA: Diagnosis not present

## 2020-10-05 DIAGNOSIS — Z6841 Body Mass Index (BMI) 40.0 and over, adult: Secondary | ICD-10-CM | POA: Diagnosis not present

## 2020-10-05 DIAGNOSIS — I10 Essential (primary) hypertension: Secondary | ICD-10-CM | POA: Diagnosis not present

## 2020-12-01 DIAGNOSIS — M2042 Other hammer toe(s) (acquired), left foot: Secondary | ICD-10-CM | POA: Diagnosis not present

## 2020-12-24 DIAGNOSIS — Z7185 Encounter for immunization safety counseling: Secondary | ICD-10-CM | POA: Diagnosis not present

## 2020-12-24 DIAGNOSIS — Z23 Encounter for immunization: Secondary | ICD-10-CM | POA: Diagnosis not present

## 2020-12-28 DIAGNOSIS — M160 Bilateral primary osteoarthritis of hip: Secondary | ICD-10-CM | POA: Diagnosis not present

## 2020-12-28 DIAGNOSIS — G301 Alzheimer's disease with late onset: Secondary | ICD-10-CM | POA: Diagnosis not present

## 2020-12-28 DIAGNOSIS — Z6841 Body Mass Index (BMI) 40.0 and over, adult: Secondary | ICD-10-CM | POA: Diagnosis not present

## 2020-12-28 DIAGNOSIS — R195 Other fecal abnormalities: Secondary | ICD-10-CM | POA: Diagnosis not present

## 2020-12-28 DIAGNOSIS — I10 Essential (primary) hypertension: Secondary | ICD-10-CM | POA: Diagnosis not present

## 2020-12-28 DIAGNOSIS — F0280 Dementia in other diseases classified elsewhere without behavioral disturbance: Secondary | ICD-10-CM | POA: Diagnosis not present

## 2021-01-04 DIAGNOSIS — Z0000 Encounter for general adult medical examination without abnormal findings: Secondary | ICD-10-CM | POA: Diagnosis not present

## 2021-01-04 DIAGNOSIS — Z79899 Other long term (current) drug therapy: Secondary | ICD-10-CM | POA: Diagnosis not present

## 2021-01-04 DIAGNOSIS — M159 Polyosteoarthritis, unspecified: Secondary | ICD-10-CM | POA: Diagnosis not present

## 2021-01-04 DIAGNOSIS — Z7689 Persons encountering health services in other specified circumstances: Secondary | ICD-10-CM | POA: Diagnosis not present

## 2021-01-04 DIAGNOSIS — I10 Essential (primary) hypertension: Secondary | ICD-10-CM | POA: Diagnosis not present

## 2021-01-04 DIAGNOSIS — F0150 Vascular dementia without behavioral disturbance: Secondary | ICD-10-CM | POA: Diagnosis not present

## 2021-01-04 DIAGNOSIS — M19042 Primary osteoarthritis, left hand: Secondary | ICD-10-CM | POA: Diagnosis not present

## 2021-01-04 DIAGNOSIS — M19041 Primary osteoarthritis, right hand: Secondary | ICD-10-CM | POA: Diagnosis not present

## 2021-01-09 ENCOUNTER — Other Ambulatory Visit: Payer: Self-pay

## 2021-01-09 ENCOUNTER — Emergency Department: Payer: Medicare HMO

## 2021-01-09 ENCOUNTER — Emergency Department
Admission: EM | Admit: 2021-01-09 | Discharge: 2021-01-09 | Disposition: A | Discharge status: Home / Self Care | Payer: Medicare HMO | Attending: Emergency Medicine | Admitting: Emergency Medicine

## 2021-01-09 DIAGNOSIS — M4710 Other spondylosis with myelopathy, site unspecified: Secondary | ICD-10-CM

## 2021-01-09 DIAGNOSIS — I10 Essential (primary) hypertension: Secondary | ICD-10-CM | POA: Diagnosis not present

## 2021-01-09 DIAGNOSIS — R0902 Hypoxemia: Secondary | ICD-10-CM | POA: Diagnosis not present

## 2021-01-09 DIAGNOSIS — Z79899 Other long term (current) drug therapy: Secondary | ICD-10-CM | POA: Diagnosis not present

## 2021-01-09 DIAGNOSIS — M545 Low back pain, unspecified: Secondary | ICD-10-CM | POA: Diagnosis not present

## 2021-01-09 DIAGNOSIS — M549 Dorsalgia, unspecified: Secondary | ICD-10-CM | POA: Diagnosis not present

## 2021-01-09 DIAGNOSIS — M4716 Other spondylosis with myelopathy, lumbar region: Secondary | ICD-10-CM | POA: Diagnosis not present

## 2021-01-09 MED ORDER — LIDOCAINE 5 % EX PTCH: 1.0000 | MEDICATED_PATCH | Freq: Two times a day (BID) | CUTANEOUS | 0 refills | Status: DC

## 2021-01-09 MED ORDER — HYDROMORPHONE HCL 1 MG/ML IJ SOLN
0.5000 mg | Freq: Once | INTRAMUSCULAR | Status: AC
  Administered 2021-01-09: 0.5 mg via INTRAVENOUS
  Filled 2021-01-09: qty 1

## 2021-01-09 NOTE — ED Notes (Addendum)
First nurse note: Pt comes EMS with severe lower back pain. Has arthritis. Got worse while sitting at her computer. Screaming in pain, refusing to get in wheelchair. Placed in recliner at this time. VSS.

## 2021-01-09 NOTE — ED Provider Notes (Signed)
[facility] Emergency Department Provider Note   ____________________________________________   Event Date/Time   First MD Initiated Contact with Patient 01/09/21 1708     (approximate)  I have reviewed the triage vital signs and the nursing notes.   HISTORY  Chief Complaint Back Pain    HPI [name] is a 87 y.o. female patient state awaken with acute low back pain and had difficulty getting out of bed.  Patient stated pain is in the lower part of her back.  Patient states she think is arthritis.  Patient  pain normally worsens when it rains.  Patient denies radicular component to her back pain.  Patient denies bladder or bowel dysfunction.  Patient pain increased with weightbearing.  Patient state heavy reliance on her cane today.  Patient stated pain so bad had to activate her alert device and arrived via EMS.  Patient describes the pain as "sharp".  Rates pain as a 3/10.  Denies fall or any other provocative incident for complaint      Past Medical History:  Diagnosis Date  . Hypertension     There are no problems to display for this patient.   Past Surgical History:  Procedure Laterality Date  . ABDOMINAL HYSTERECTOMY    . CHOLECYSTECTOMY      Prior to Admission medications   Medication Sig Start Date End Date Taking? Authorizing Provider  lidocaine (LIDODERM) 5 % Place 1 patch onto the skin every 12 (twelve) hours. Remove & Discard patch within 12 hours or as directed by MD 01/09/21 01/09/22 Yes Joni Reining, PA-C  celecoxib (CELEBREX) 50 MG capsule Take 1 capsule (50 mg total) by mouth 2 (two) times daily. 09/02/18   Anson Oregon, PA-C  donepezil (ARICEPT) 10 MG tablet Take 10 mg by mouth at bedtime.    [provider]  hydrochlorothiazide (HYDRODIURIL) 25 MG tablet Take 25 mg by mouth daily.    [provider]  indomethacin (INDOCIN) 25 MG capsule Take 1 capsule (25 mg total) by mouth 3 (three) times daily  as needed. 11/07/15   Jolene Provost, MD  Multiple Vitamin (MULTIVITAMIN) capsule Take 1 capsule by mouth daily.    [provider]  omeprazole (PRILOSEC) 20 MG capsule Take 20 mg by mouth 2 (two) times daily before a meal.    [provider]  potassium chloride (K-DUR) 10 MEQ tablet Take 20 mEq by mouth 2 (two) times daily.     [provider]  sucralfate (CARAFATE) 1 g tablet Take 1 tablet (1 g total) by mouth 2 (two) times daily. 09/02/18   Anson Oregon, PA-C    Allergies Ace inhibitors  No family history on file.  Social History Social History   Tobacco Use  . Smoking status: Never Smoker  . Smokeless tobacco: Never Used  Substance Use Topics  . Alcohol use: No  . Drug use: No    Review of Systems Mild dementia constitutional: No fever/chills Eyes: No visual changes. ENT: No sore throat. Cardiovascular: Denies chest pain. Respiratory: Denies shortness of breath. Gastrointestinal: No abdominal pain.  No nausea, no vomiting.  No diarrhea.  No constipation. Genitourinary: Negative for dysuria. Musculoskeletal: Positive for back pain. Skin: Negative for rash. Neurological: Negative for headaches, focal weakness or numbness. Psychiatric:  Mild dementia Endocrine:  Hypertension Allergic/Immunilogical: ACE inhibitors. ____________________________________________   PHYSICAL EXAM:  VITAL SIGNS: ED Triage Vitals  Enc Vitals Group     BP 01/09/21 1644 (!) 157/60  Pulse Rate 01/09/21 1644 60     Resp 01/09/21 1644 18     Temp 01/09/21 1644 98.2 F (36.8 C)     Temp Source 01/09/21 1644 Oral     SpO2 01/09/21 1644 97 %     Weight 01/09/21 1642 230 lb (104.3 kg)     Height 01/09/21 1642 5\' 4"  (1.626 m)     Head Circumference --      Peak Flow --      Pain Score 01/09/21 1642 3     Pain Loc --      Pain Edu? --      Excl. in GC? --    Constitutional: Alert and oriented. Well appearing and in no acute distress. Eyes: Conjunctivae  are normal. PERRL. EOMI. Head: Atraumatic. Nose: No congestion/rhinnorhea. Mouth/Throat: Mucous membranes are moist.  Oropharynx non-erythematous. Neck: No stridor.  cervical spine tenderness to palpation. Hematological/Lymphatic/Immunilogical: No cervical lymphadenopathy. Cardiovascular: Normal rate, regular rhythm. Grossly normal heart sounds.  Good peripheral circulation.  Elevated blood pressure. Respiratory: Normal respiratory effort.  No retractions. Lungs CTAB. Gastrointestinal: Soft and nontender. No distention. No abdominal bruits. No CVA tenderness. Genitourinary: Deferred Musculoskeletal: Patient has moderate scoliosis of thoracic and lumbar spine.  Patient is moderate guarding with palpation of the spine.  Moderate degenerative changes from anterior spurs from L2-L5.  And lumbar spine.  Patient has decreased range of motion is all fields.  Unable to perform straight leg test because the patient is very uncomfortable with any touch and of the lower extremities of her body.   Neurologic:  Normal speech and language. No gross focal neurologic deficits are appreciated. No gait instability. Skin:  Skin is warm, dry and intact. No rash noted. Psychiatric: Mood and affect are normal. Speech and behavior are normal.  ____________________________________________   LABS (all labs ordered are listed, but only abnormal results are displayed)  Labs Reviewed - No data to display ____________________________________________  EKG   ____________________________________________  RADIOLOGY I, 03/11/21, personally viewed and evaluated these images (plain radiographs) as part of my medical decision making, as well as reviewing the written report by the radiologist.  ED MD interpretation: Patient has moderate degree of scoliosis of the thoracic spine.  Patient has moderate degenerative changes with anterior spurs from L to through L5.  No acute findings. Official radiology report(s): DG  Lumbar Spine 2-3 Views  Result Date: 01/09/2021 CLINICAL DATA:  Acute onset low back pain this morning. EXAM: LUMBAR SPINE - 2-3 VIEW COMPARISON:  Abdomen and pelvis CT dated 09/28/2018 FINDINGS: Five non-rib-bearing lumbar vertebrae. Stable mild to moderate levoconvex thoracolumbar rotary scoliosis. Stable extensive facet degenerative changes at multiple levels with associated grade 1 anterolisthesis at the L4-5 level. Mild to moderate anterior spur formation at multiple levels. No fractures or pars defects. No acute subluxations. IMPRESSION: Stable scoliosis and degenerative changes.  No acute abnormality. Electronically Signed   By: 09/30/2018 M.D.   On: 01/09/2021 18:45    ____________________________________________   PROCEDURES  Procedure(s) performed (including Critical Care):  Procedures   ____________________________________________   INITIAL IMPRESSION / ASSESSMENT AND PLAN / ED COURSE  As part of my medical decision making, I reviewed the following data within the electronic MEDICAL RECORD NUMBER        Patient presented with a.m. awakening of acute low back pain.  Patient history of chronic back pain secondary to arthritis.  Patient stated pain increased secondary to weather change.  Patient denies radicular component there is no  bladder or bowel dysfunction.  Discussed x-ray findings with patient which is consistent for history of moderate scoliosis of the lumbar spine and moderate degenerative change of the lumbar spine.  Patient responded well to 0.5 mg of Dilaudid.  Patient given discharge care instruction a prescription for Lidoderm patches.  Patient advised to continue to take extra strength Tylenol.  Patient given a walker to assist with ambulation.  Follow-up with PCP.      ____________________________________________   FINAL CLINICAL IMPRESSION(S) / ED DIAGNOSES  Final diagnoses:  Osteoarthritis of spine with myelopathy, unspecified spinal region     ED  Discharge Orders         Ordered    lidocaine (LIDODERM) 5 %  Every 12 hours        01/09/21 1840          *Please note:  [name] was evaluated in Emergency Department on 01/09/2021 for the symptoms described in the history of present illness. She was evaluated in the context of the global COVID-19 pandemic, which necessitated consideration that the patient might be at risk for infection with the SARS-CoV-2 virus that causes COVID-19. Institutional protocols and algorithms that pertain to the evaluation of patients at risk for COVID-19 are in a state of rapid change based on information released by regulatory bodies including the CDC and federal and state organizations. These policies and algorithms were followed during the patient's care in the ED.  Some ED evaluations and interventions may be delayed as a result of limited staffing during and the pandemic.*   Note:  This document was prepared using Dragon voice recognition software and may include unintentional dictation errors.    Joni Reining, PA-C 01/09/21 Vinnie Langton    Shaune Pollack, MD 01/10/21 [phone]

## 2021-01-09 NOTE — ED Notes (Signed)
Pt taken to xray 

## 2021-01-09 NOTE — ED Triage Notes (Signed)
Pt states she woke up with back pain and she was having a difficult time getting ready and getting out of the chair- pt states that it is just her lower back- pt states she thinks it is arthritis because this usually happens when it rains, this time it is just worse

## 2021-01-09 NOTE — Discharge Instructions (Addendum)
Your x-ray revealed stable scoliosis and moderate degenerative changes of the lumbar spine.  Read and follow discharge care instruction.  Wear Lidoderm patches as directed take extra strength Tylenol.  Use cane/walker to assist with ambulation.

## 2021-01-12 DIAGNOSIS — M5416 Radiculopathy, lumbar region: Secondary | ICD-10-CM | POA: Diagnosis not present

## 2021-01-12 DIAGNOSIS — I10 Essential (primary) hypertension: Secondary | ICD-10-CM | POA: Diagnosis not present

## 2021-01-12 DIAGNOSIS — F0150 Vascular dementia without behavioral disturbance: Secondary | ICD-10-CM | POA: Diagnosis not present

## 2021-01-12 DIAGNOSIS — M1712 Unilateral primary osteoarthritis, left knee: Secondary | ICD-10-CM | POA: Diagnosis not present

## 2021-01-15 DIAGNOSIS — G309 Alzheimer's disease, unspecified: Secondary | ICD-10-CM | POA: Diagnosis not present

## 2021-01-15 DIAGNOSIS — M4726 Other spondylosis with radiculopathy, lumbar region: Secondary | ICD-10-CM | POA: Diagnosis not present

## 2021-01-15 DIAGNOSIS — M1712 Unilateral primary osteoarthritis, left knee: Secondary | ICD-10-CM | POA: Diagnosis not present

## 2021-01-15 DIAGNOSIS — I10 Essential (primary) hypertension: Secondary | ICD-10-CM | POA: Diagnosis not present

## 2021-01-15 DIAGNOSIS — M19042 Primary osteoarthritis, left hand: Secondary | ICD-10-CM | POA: Diagnosis not present

## 2021-01-15 DIAGNOSIS — F0280 Dementia in other diseases classified elsewhere without behavioral disturbance: Secondary | ICD-10-CM | POA: Diagnosis not present

## 2021-01-15 DIAGNOSIS — M19041 Primary osteoarthritis, right hand: Secondary | ICD-10-CM | POA: Diagnosis not present

## 2021-01-15 DIAGNOSIS — M4716 Other spondylosis with myelopathy, lumbar region: Secondary | ICD-10-CM | POA: Diagnosis not present

## 2021-01-15 DIAGNOSIS — M160 Bilateral primary osteoarthritis of hip: Secondary | ICD-10-CM | POA: Diagnosis not present

## 2021-01-19 DIAGNOSIS — M160 Bilateral primary osteoarthritis of hip: Secondary | ICD-10-CM | POA: Diagnosis not present

## 2021-01-19 DIAGNOSIS — M1712 Unilateral primary osteoarthritis, left knee: Secondary | ICD-10-CM | POA: Diagnosis not present

## 2021-01-19 DIAGNOSIS — G309 Alzheimer's disease, unspecified: Secondary | ICD-10-CM | POA: Diagnosis not present

## 2021-01-19 DIAGNOSIS — M19041 Primary osteoarthritis, right hand: Secondary | ICD-10-CM | POA: Diagnosis not present

## 2021-01-19 DIAGNOSIS — I10 Essential (primary) hypertension: Secondary | ICD-10-CM | POA: Diagnosis not present

## 2021-01-19 DIAGNOSIS — M19042 Primary osteoarthritis, left hand: Secondary | ICD-10-CM | POA: Diagnosis not present

## 2021-01-19 DIAGNOSIS — F0280 Dementia in other diseases classified elsewhere without behavioral disturbance: Secondary | ICD-10-CM | POA: Diagnosis not present

## 2021-01-19 DIAGNOSIS — M4716 Other spondylosis with myelopathy, lumbar region: Secondary | ICD-10-CM | POA: Diagnosis not present

## 2021-01-19 DIAGNOSIS — M4726 Other spondylosis with radiculopathy, lumbar region: Secondary | ICD-10-CM | POA: Diagnosis not present

## 2021-01-21 DIAGNOSIS — M4726 Other spondylosis with radiculopathy, lumbar region: Secondary | ICD-10-CM | POA: Diagnosis not present

## 2021-01-21 DIAGNOSIS — M1712 Unilateral primary osteoarthritis, left knee: Secondary | ICD-10-CM | POA: Diagnosis not present

## 2021-01-21 DIAGNOSIS — I10 Essential (primary) hypertension: Secondary | ICD-10-CM | POA: Diagnosis not present

## 2021-01-21 DIAGNOSIS — M4716 Other spondylosis with myelopathy, lumbar region: Secondary | ICD-10-CM | POA: Diagnosis not present

## 2021-01-21 DIAGNOSIS — F0280 Dementia in other diseases classified elsewhere without behavioral disturbance: Secondary | ICD-10-CM | POA: Diagnosis not present

## 2021-01-21 DIAGNOSIS — M160 Bilateral primary osteoarthritis of hip: Secondary | ICD-10-CM | POA: Diagnosis not present

## 2021-01-21 DIAGNOSIS — G309 Alzheimer's disease, unspecified: Secondary | ICD-10-CM | POA: Diagnosis not present

## 2021-01-28 DIAGNOSIS — M4726 Other spondylosis with radiculopathy, lumbar region: Secondary | ICD-10-CM | POA: Diagnosis not present

## 2021-01-28 DIAGNOSIS — F0280 Dementia in other diseases classified elsewhere without behavioral disturbance: Secondary | ICD-10-CM | POA: Diagnosis not present

## 2021-01-28 DIAGNOSIS — G309 Alzheimer's disease, unspecified: Secondary | ICD-10-CM | POA: Diagnosis not present

## 2021-01-28 DIAGNOSIS — M160 Bilateral primary osteoarthritis of hip: Secondary | ICD-10-CM | POA: Diagnosis not present

## 2021-01-28 DIAGNOSIS — I10 Essential (primary) hypertension: Secondary | ICD-10-CM | POA: Diagnosis not present

## 2021-01-28 DIAGNOSIS — M19041 Primary osteoarthritis, right hand: Secondary | ICD-10-CM | POA: Diagnosis not present

## 2021-01-28 DIAGNOSIS — M1712 Unilateral primary osteoarthritis, left knee: Secondary | ICD-10-CM | POA: Diagnosis not present

## 2021-01-28 DIAGNOSIS — M19042 Primary osteoarthritis, left hand: Secondary | ICD-10-CM | POA: Diagnosis not present

## 2021-01-28 DIAGNOSIS — M4716 Other spondylosis with myelopathy, lumbar region: Secondary | ICD-10-CM | POA: Diagnosis not present

## 2021-02-01 DIAGNOSIS — F0280 Dementia in other diseases classified elsewhere without behavioral disturbance: Secondary | ICD-10-CM | POA: Diagnosis not present

## 2021-02-01 DIAGNOSIS — M160 Bilateral primary osteoarthritis of hip: Secondary | ICD-10-CM | POA: Diagnosis not present

## 2021-02-01 DIAGNOSIS — M19042 Primary osteoarthritis, left hand: Secondary | ICD-10-CM | POA: Diagnosis not present

## 2021-02-01 DIAGNOSIS — M1712 Unilateral primary osteoarthritis, left knee: Secondary | ICD-10-CM | POA: Diagnosis not present

## 2021-02-01 DIAGNOSIS — G309 Alzheimer's disease, unspecified: Secondary | ICD-10-CM | POA: Diagnosis not present

## 2021-02-01 DIAGNOSIS — I10 Essential (primary) hypertension: Secondary | ICD-10-CM | POA: Diagnosis not present

## 2021-02-01 DIAGNOSIS — M4726 Other spondylosis with radiculopathy, lumbar region: Secondary | ICD-10-CM | POA: Diagnosis not present

## 2021-02-01 DIAGNOSIS — M4716 Other spondylosis with myelopathy, lumbar region: Secondary | ICD-10-CM | POA: Diagnosis not present

## 2021-02-01 DIAGNOSIS — M19041 Primary osteoarthritis, right hand: Secondary | ICD-10-CM | POA: Diagnosis not present

## 2021-02-03 DIAGNOSIS — M19041 Primary osteoarthritis, right hand: Secondary | ICD-10-CM | POA: Diagnosis not present

## 2021-02-03 DIAGNOSIS — M160 Bilateral primary osteoarthritis of hip: Secondary | ICD-10-CM | POA: Diagnosis not present

## 2021-02-03 DIAGNOSIS — M19042 Primary osteoarthritis, left hand: Secondary | ICD-10-CM | POA: Diagnosis not present

## 2021-02-03 DIAGNOSIS — M4726 Other spondylosis with radiculopathy, lumbar region: Secondary | ICD-10-CM | POA: Diagnosis not present

## 2021-02-03 DIAGNOSIS — G309 Alzheimer's disease, unspecified: Secondary | ICD-10-CM | POA: Diagnosis not present

## 2021-02-03 DIAGNOSIS — F0280 Dementia in other diseases classified elsewhere without behavioral disturbance: Secondary | ICD-10-CM | POA: Diagnosis not present

## 2021-02-03 DIAGNOSIS — I10 Essential (primary) hypertension: Secondary | ICD-10-CM | POA: Diagnosis not present

## 2021-02-03 DIAGNOSIS — M1712 Unilateral primary osteoarthritis, left knee: Secondary | ICD-10-CM | POA: Diagnosis not present

## 2021-02-03 DIAGNOSIS — M4716 Other spondylosis with myelopathy, lumbar region: Secondary | ICD-10-CM | POA: Diagnosis not present

## 2021-02-10 DIAGNOSIS — G309 Alzheimer's disease, unspecified: Secondary | ICD-10-CM | POA: Diagnosis not present

## 2021-02-10 DIAGNOSIS — I10 Essential (primary) hypertension: Secondary | ICD-10-CM | POA: Diagnosis not present

## 2021-02-10 DIAGNOSIS — F0280 Dementia in other diseases classified elsewhere without behavioral disturbance: Secondary | ICD-10-CM | POA: Diagnosis not present

## 2021-02-10 DIAGNOSIS — M4716 Other spondylosis with myelopathy, lumbar region: Secondary | ICD-10-CM | POA: Diagnosis not present

## 2021-02-10 DIAGNOSIS — M19041 Primary osteoarthritis, right hand: Secondary | ICD-10-CM | POA: Diagnosis not present

## 2021-02-10 DIAGNOSIS — M4726 Other spondylosis with radiculopathy, lumbar region: Secondary | ICD-10-CM | POA: Diagnosis not present

## 2021-02-10 DIAGNOSIS — M160 Bilateral primary osteoarthritis of hip: Secondary | ICD-10-CM | POA: Diagnosis not present

## 2021-02-10 DIAGNOSIS — M19042 Primary osteoarthritis, left hand: Secondary | ICD-10-CM | POA: Diagnosis not present

## 2021-02-10 DIAGNOSIS — M1712 Unilateral primary osteoarthritis, left knee: Secondary | ICD-10-CM | POA: Diagnosis not present

## 2021-02-15 DIAGNOSIS — G309 Alzheimer's disease, unspecified: Secondary | ICD-10-CM | POA: Diagnosis not present

## 2021-02-15 DIAGNOSIS — M160 Bilateral primary osteoarthritis of hip: Secondary | ICD-10-CM | POA: Diagnosis not present

## 2021-02-15 DIAGNOSIS — M4726 Other spondylosis with radiculopathy, lumbar region: Secondary | ICD-10-CM | POA: Diagnosis not present

## 2021-02-15 DIAGNOSIS — I10 Essential (primary) hypertension: Secondary | ICD-10-CM | POA: Diagnosis not present

## 2021-02-15 DIAGNOSIS — M1712 Unilateral primary osteoarthritis, left knee: Secondary | ICD-10-CM | POA: Diagnosis not present

## 2021-02-15 DIAGNOSIS — M19042 Primary osteoarthritis, left hand: Secondary | ICD-10-CM | POA: Diagnosis not present

## 2021-02-15 DIAGNOSIS — M19041 Primary osteoarthritis, right hand: Secondary | ICD-10-CM | POA: Diagnosis not present

## 2021-02-15 DIAGNOSIS — F0280 Dementia in other diseases classified elsewhere without behavioral disturbance: Secondary | ICD-10-CM | POA: Diagnosis not present

## 2021-02-15 DIAGNOSIS — M4716 Other spondylosis with myelopathy, lumbar region: Secondary | ICD-10-CM | POA: Diagnosis not present

## 2021-02-25 DIAGNOSIS — M19041 Primary osteoarthritis, right hand: Secondary | ICD-10-CM | POA: Diagnosis not present

## 2021-02-25 DIAGNOSIS — M4726 Other spondylosis with radiculopathy, lumbar region: Secondary | ICD-10-CM | POA: Diagnosis not present

## 2021-02-25 DIAGNOSIS — F0280 Dementia in other diseases classified elsewhere without behavioral disturbance: Secondary | ICD-10-CM | POA: Diagnosis not present

## 2021-02-25 DIAGNOSIS — M160 Bilateral primary osteoarthritis of hip: Secondary | ICD-10-CM | POA: Diagnosis not present

## 2021-02-25 DIAGNOSIS — I10 Essential (primary) hypertension: Secondary | ICD-10-CM | POA: Diagnosis not present

## 2021-02-25 DIAGNOSIS — M4716 Other spondylosis with myelopathy, lumbar region: Secondary | ICD-10-CM | POA: Diagnosis not present

## 2021-02-25 DIAGNOSIS — M19042 Primary osteoarthritis, left hand: Secondary | ICD-10-CM | POA: Diagnosis not present

## 2021-02-25 DIAGNOSIS — G309 Alzheimer's disease, unspecified: Secondary | ICD-10-CM | POA: Diagnosis not present

## 2021-02-25 DIAGNOSIS — M1712 Unilateral primary osteoarthritis, left knee: Secondary | ICD-10-CM | POA: Diagnosis not present

## 2021-04-23 DIAGNOSIS — I10 Essential (primary) hypertension: Secondary | ICD-10-CM | POA: Diagnosis not present

## 2021-04-23 DIAGNOSIS — R195 Other fecal abnormalities: Secondary | ICD-10-CM | POA: Diagnosis not present

## 2021-04-23 DIAGNOSIS — M1712 Unilateral primary osteoarthritis, left knee: Secondary | ICD-10-CM | POA: Diagnosis not present

## 2021-04-23 DIAGNOSIS — Z0000 Encounter for general adult medical examination without abnormal findings: Secondary | ICD-10-CM | POA: Diagnosis not present

## 2021-04-23 DIAGNOSIS — Z6839 Body mass index (BMI) 39.0-39.9, adult: Secondary | ICD-10-CM | POA: Diagnosis not present

## 2021-04-23 DIAGNOSIS — G301 Alzheimer's disease with late onset: Secondary | ICD-10-CM | POA: Diagnosis not present

## 2021-04-23 DIAGNOSIS — Z6841 Body Mass Index (BMI) 40.0 and over, adult: Secondary | ICD-10-CM | POA: Diagnosis not present

## 2021-04-23 DIAGNOSIS — F0280 Dementia in other diseases classified elsewhere without behavioral disturbance: Secondary | ICD-10-CM | POA: Diagnosis not present

## 2021-04-23 DIAGNOSIS — D649 Anemia, unspecified: Secondary | ICD-10-CM | POA: Diagnosis not present

## 2021-04-30 DIAGNOSIS — M170 Bilateral primary osteoarthritis of knee: Secondary | ICD-10-CM | POA: Diagnosis not present

## 2021-04-30 DIAGNOSIS — Z0000 Encounter for general adult medical examination without abnormal findings: Secondary | ICD-10-CM | POA: Diagnosis not present

## 2021-04-30 DIAGNOSIS — Z6839 Body mass index (BMI) 39.0-39.9, adult: Secondary | ICD-10-CM | POA: Diagnosis not present

## 2021-04-30 DIAGNOSIS — I10 Essential (primary) hypertension: Secondary | ICD-10-CM | POA: Diagnosis not present

## 2021-04-30 DIAGNOSIS — Z23 Encounter for immunization: Secondary | ICD-10-CM | POA: Diagnosis not present

## 2021-04-30 DIAGNOSIS — Z79899 Other long term (current) drug therapy: Secondary | ICD-10-CM | POA: Diagnosis not present

## 2021-04-30 DIAGNOSIS — F0150 Vascular dementia without behavioral disturbance: Secondary | ICD-10-CM | POA: Diagnosis not present

## 2021-07-28 DIAGNOSIS — I10 Essential (primary) hypertension: Secondary | ICD-10-CM | POA: Diagnosis not present

## 2021-07-28 DIAGNOSIS — E876 Hypokalemia: Secondary | ICD-10-CM | POA: Diagnosis not present

## 2021-07-28 DIAGNOSIS — F0280 Dementia in other diseases classified elsewhere without behavioral disturbance: Secondary | ICD-10-CM | POA: Diagnosis not present

## 2021-07-28 DIAGNOSIS — R8290 Unspecified abnormal findings in urine: Secondary | ICD-10-CM | POA: Diagnosis not present

## 2021-07-28 DIAGNOSIS — M160 Bilateral primary osteoarthritis of hip: Secondary | ICD-10-CM | POA: Diagnosis not present

## 2021-07-28 DIAGNOSIS — G301 Alzheimer's disease with late onset: Secondary | ICD-10-CM | POA: Diagnosis not present

## 2021-08-06 DIAGNOSIS — Z23 Encounter for immunization: Secondary | ICD-10-CM | POA: Diagnosis not present

## 2021-08-06 DIAGNOSIS — I10 Essential (primary) hypertension: Secondary | ICD-10-CM | POA: Diagnosis not present

## 2021-08-06 DIAGNOSIS — K219 Gastro-esophageal reflux disease without esophagitis: Secondary | ICD-10-CM | POA: Diagnosis not present

## 2021-08-06 DIAGNOSIS — Z6835 Body mass index (BMI) 35.0-35.9, adult: Secondary | ICD-10-CM | POA: Diagnosis not present

## 2021-08-06 DIAGNOSIS — M159 Polyosteoarthritis, unspecified: Secondary | ICD-10-CM | POA: Diagnosis not present

## 2021-08-06 DIAGNOSIS — F0150 Vascular dementia without behavioral disturbance: Secondary | ICD-10-CM | POA: Diagnosis not present

## 2021-11-01 ENCOUNTER — Other Ambulatory Visit: Payer: Self-pay

## 2021-11-01 ENCOUNTER — Emergency Department: Payer: Medicare PPO

## 2021-11-01 ENCOUNTER — Emergency Department
Admission: EM | Admit: 2021-11-01 | Discharge: 2021-11-01 | Disposition: A | Discharge status: Home / Self Care | Payer: Medicare PPO | Attending: Emergency Medicine | Admitting: Emergency Medicine

## 2021-11-01 DIAGNOSIS — M47816 Spondylosis without myelopathy or radiculopathy, lumbar region: Secondary | ICD-10-CM

## 2021-11-01 DIAGNOSIS — M545 Low back pain, unspecified: Secondary | ICD-10-CM | POA: Diagnosis present

## 2021-11-01 MED ORDER — PREDNISONE 10 MG PO TABS: ORAL_TABLET | ORAL | 0 refills | Status: AC

## 2021-11-01 MED ORDER — DEXAMETHASONE SODIUM PHOSPHATE 10 MG/ML IJ SOLN
10.0000 mg | Freq: Once | INTRAMUSCULAR | Status: AC
Start: 2021-11-01 — End: 2021-11-01
  Administered 2021-11-01: 10 mg via INTRAMUSCULAR
  Filled 2021-11-01: qty 1

## 2021-11-01 NOTE — ED Triage Notes (Signed)
Pt comes with c/o bilateral low back pain and some hip pain. Pt states VSS. Pt states hx of this. No falls pt is sharp.

## 2021-11-01 NOTE — Discharge Instructions (Addendum)
Follow-up with your primary care provider or call make an appoint with Dr. Joice Lofts who is the orthopedist on-call.  He is also located in the [facility] building.  You may continue using your Lidoderm patches as prescribed by your doctor.  Also a low-dose of prednisone was sent to the pharmacy for you to take on a daily basis for the next 5 days to see if this helps with your pain.  Most of the pain medications will cause drowsiness and increase your risk for falling.  You may apply ice or heat to your back as needed for discomfort.

## 2021-11-01 NOTE — ED Provider Notes (Signed)
[facility] Provider Note    Event Date/Time   First MD Initiated Contact with Patient 11/01/21 1130     (approximate)   History   Back Pain   HPI  [name] is a 88 y.o. female presents to the ED with complaint of bilateral low back pain with some pain to her hip.  Patient denies any recent injury and has had problems with her back in the past.  Patient states with any movement she has pain.  She has in the past taken Celebrex and indomethacin.  Currently she is using a pain patch to her back.  She denies any urinary symptoms or history of kidney stones.  She denies any incontinence of bowel or bladder or saddle anesthesias to suggest cauda equina.  She rates her pain as a 10/10.      Physical Exam   Triage Vital Signs: ED Triage Vitals  Enc Vitals Group     BP 11/01/21 1102 (!) 155/56     Pulse Rate 11/01/21 1102 (!) 52     Resp 11/01/21 1102 20     Temp 11/01/21 1102 99.1 F (37.3 C)     Temp src --      SpO2 11/01/21 1102 97 %     Weight 11/01/21 1127 229 lb 4.5 oz (104 kg)     Height 11/01/21 1127 5' (1.524 m)     Head Circumference --      Peak Flow --      Pain Score 11/01/21 1058 10     Pain Loc --      Pain Edu? --      Excl. in GC? --     Most recent vital signs: Vitals:   11/01/21 1102  BP: (!) 155/56  Pulse: (!) 52  Resp: 20  Temp: 99.1 F (37.3 C)  SpO2: 97%     General: Awake, no distress.  Sitting in a wheelchair and guarding against movement. CV:  Good peripheral perfusion.  Heart regular rate and rhythm. Resp:  Normal effort.  Lungs are clear bilaterally. Abd:  No distention.  Other:  On examination of the back there is marked tenderness on palpation of the lower lumbar spine and SI joint area and surrounding tissue.  Range of motion is guarded secondary to increased pain.  No gross deformity or skin discoloration to suggest injury present.   ED Results / Procedures / Treatments   Labs (all labs ordered  are listed, but only abnormal results are displayed) Labs Reviewed - No data to display    RADIOLOGY Lumbar spine x-ray was reviewed by myself with degenerative changes noted.  Radiology report confirms no recent fracture.  There is mild anterior listhesis at L4-L5 and L5-S1 levels.  Lumbar spondylosis more severe in the facet joints at L4-L5 and L5-S1 and levels.   PROCEDURES:  Critical Care performed:   Procedures   MEDICATIONS ORDERED IN ED: Medications  dexamethasone (DECADRON) injection 10 mg (10 mg Intramuscular Given 11/01/21 1354)     IMPRESSION / MDM / ASSESSMENT AND PLAN / ED COURSE  I reviewed the triage vital signs and the nursing notes.   Differential diagnosis includes, but is not limited to, chronic low back pain, new compression fracture, muscle strain.  88 year old female presents to the ED with complaint of low back pain with a history of chronic low back pain.  Patient denies any injury.  X-rays confirm that patient does have degenerative changes and spondylosis of  the lumbar spine.  With the family member present we discussed the use of narcotics for her back and the high risk for falling at home as she is 88 years old and currently uses a cane to ambulate.  Patient is unable to move in the wheelchair without increasing her pain.  Patient currently has a prescription for Lidoderm patches that she is using now.  Will try Decadron 10 mg IM M while in the ED and a very small dose of prednisone 10 mg daily for the next 5 days to see if this helps calm down the inflammation that she is experiencing at this time.  This is more of an exacerbation of her chronic back pain.  She is encouraged to use ice or heat to her back as needed for discomfort and also use pillows to elevate her knees which may help with the muscles in her lower back.  She is to follow-up with her PCP or Dr. Joice Lofts who is on-call for orthopedics if not improving.        FINAL CLINICAL IMPRESSION(S) /  ED DIAGNOSES   Final diagnoses:  Spondylosis of lumbar spine  Osteoarthritis of lumbar spine, unspecified spinal osteoarthritis complication status     Rx / DC Orders   ED Discharge Orders          Ordered    predniSONE (DELTASONE) 10 MG tablet        11/01/21 1432             Note:  This document was prepared using Dragon voice recognition software and may include unintentional dictation errors.   Tommi Rumps, PA-C 11/01/21 1649    Jene Every, MD 11/01/21 [phone]

## 2021-11-01 NOTE — ED Notes (Signed)
See triage note  presents with lower back pain  states pain is mainly in buttock area and moves into both legs

## 2021-11-11 ENCOUNTER — Other Ambulatory Visit: Payer: Self-pay | Admitting: Orthopedic Surgery

## 2021-11-11 DIAGNOSIS — M5416 Radiculopathy, lumbar region: Secondary | ICD-10-CM

## 2021-11-24 ENCOUNTER — Ambulatory Visit
Admission: RE | Admit: 2021-11-24 | Discharge: 2021-11-24 | Disposition: A | Discharge status: Home / Self Care | Payer: Medicare PPO | Source: Ambulatory Visit | Attending: Orthopedic Surgery | Admitting: Orthopedic Surgery

## 2021-11-24 DIAGNOSIS — M5416 Radiculopathy, lumbar region: Secondary | ICD-10-CM | POA: Insufficient documentation

## 2021-12-20 DIAGNOSIS — K219 Gastro-esophageal reflux disease without esophagitis: Secondary | ICD-10-CM | POA: Diagnosis not present

## 2021-12-20 DIAGNOSIS — Z791 Long term (current) use of non-steroidal anti-inflammatories (NSAID): Secondary | ICD-10-CM | POA: Diagnosis not present

## 2021-12-20 DIAGNOSIS — J309 Allergic rhinitis, unspecified: Secondary | ICD-10-CM | POA: Diagnosis not present

## 2021-12-20 DIAGNOSIS — M1990 Unspecified osteoarthritis, unspecified site: Secondary | ICD-10-CM | POA: Diagnosis not present

## 2021-12-20 DIAGNOSIS — F03A0 Unspecified dementia, mild, without behavioral disturbance, psychotic disturbance, mood disturbance, and anxiety: Secondary | ICD-10-CM | POA: Diagnosis not present

## 2021-12-20 DIAGNOSIS — M109 Gout, unspecified: Secondary | ICD-10-CM | POA: Diagnosis not present

## 2021-12-20 DIAGNOSIS — I10 Essential (primary) hypertension: Secondary | ICD-10-CM | POA: Diagnosis not present

## 2022-01-03 DIAGNOSIS — M48062 Spinal stenosis, lumbar region with neurogenic claudication: Secondary | ICD-10-CM | POA: Diagnosis not present

## 2022-01-17 DIAGNOSIS — M5442 Lumbago with sciatica, left side: Secondary | ICD-10-CM | POA: Diagnosis not present

## 2022-01-17 DIAGNOSIS — M48062 Spinal stenosis, lumbar region with neurogenic claudication: Secondary | ICD-10-CM | POA: Diagnosis not present

## 2022-01-17 DIAGNOSIS — G8929 Other chronic pain: Secondary | ICD-10-CM | POA: Diagnosis not present

## 2022-01-17 DIAGNOSIS — M5441 Lumbago with sciatica, right side: Secondary | ICD-10-CM | POA: Diagnosis not present

## 2022-02-02 DIAGNOSIS — M47816 Spondylosis without myelopathy or radiculopathy, lumbar region: Secondary | ICD-10-CM | POA: Diagnosis not present

## 2022-02-02 DIAGNOSIS — M5442 Lumbago with sciatica, left side: Secondary | ICD-10-CM | POA: Diagnosis not present

## 2022-02-02 DIAGNOSIS — R262 Difficulty in walking, not elsewhere classified: Secondary | ICD-10-CM | POA: Diagnosis not present

## 2022-02-02 DIAGNOSIS — Z6835 Body mass index (BMI) 35.0-35.9, adult: Secondary | ICD-10-CM | POA: Diagnosis not present

## 2022-02-02 DIAGNOSIS — Z23 Encounter for immunization: Secondary | ICD-10-CM | POA: Diagnosis not present

## 2022-02-02 DIAGNOSIS — M5441 Lumbago with sciatica, right side: Secondary | ICD-10-CM | POA: Diagnosis not present

## 2022-02-02 DIAGNOSIS — D649 Anemia, unspecified: Secondary | ICD-10-CM | POA: Diagnosis not present

## 2022-02-02 DIAGNOSIS — I10 Essential (primary) hypertension: Secondary | ICD-10-CM | POA: Diagnosis not present

## 2022-02-07 DIAGNOSIS — Z6836 Body mass index (BMI) 36.0-36.9, adult: Secondary | ICD-10-CM | POA: Diagnosis not present

## 2022-02-07 DIAGNOSIS — R262 Difficulty in walking, not elsewhere classified: Secondary | ICD-10-CM | POA: Diagnosis not present

## 2022-02-07 DIAGNOSIS — I10 Essential (primary) hypertension: Secondary | ICD-10-CM | POA: Diagnosis not present

## 2022-02-07 DIAGNOSIS — M25551 Pain in right hip: Secondary | ICD-10-CM | POA: Diagnosis not present

## 2022-02-07 DIAGNOSIS — K219 Gastro-esophageal reflux disease without esophagitis: Secondary | ICD-10-CM | POA: Diagnosis not present

## 2022-02-07 DIAGNOSIS — Z0100 Encounter for examination of eyes and vision without abnormal findings: Secondary | ICD-10-CM | POA: Diagnosis not present

## 2022-02-23 ENCOUNTER — Other Ambulatory Visit: Payer: Self-pay | Admitting: Internal Medicine

## 2022-02-23 DIAGNOSIS — N644 Mastodynia: Secondary | ICD-10-CM

## 2022-03-15 DIAGNOSIS — H26492 Other secondary cataract, left eye: Secondary | ICD-10-CM | POA: Diagnosis not present

## 2022-03-15 DIAGNOSIS — H524 Presbyopia: Secondary | ICD-10-CM | POA: Diagnosis not present

## 2022-03-16 ENCOUNTER — Ambulatory Visit
Admission: RE | Admit: 2022-03-16 | Discharge: 2022-03-16 | Disposition: A | Discharge status: Home / Self Care | Payer: Medicare HMO | Source: Ambulatory Visit | Attending: Internal Medicine | Admitting: Internal Medicine

## 2022-03-16 DIAGNOSIS — N644 Mastodynia: Secondary | ICD-10-CM | POA: Diagnosis not present

## 2022-04-21 DIAGNOSIS — F0150 Vascular dementia without behavioral disturbance: Secondary | ICD-10-CM | POA: Diagnosis not present

## 2022-04-21 DIAGNOSIS — I10 Essential (primary) hypertension: Secondary | ICD-10-CM | POA: Diagnosis not present

## 2022-04-21 DIAGNOSIS — Z6835 Body mass index (BMI) 35.0-35.9, adult: Secondary | ICD-10-CM | POA: Diagnosis not present

## 2022-04-21 DIAGNOSIS — R262 Difficulty in walking, not elsewhere classified: Secondary | ICD-10-CM | POA: Diagnosis not present

## 2022-04-21 DIAGNOSIS — M545 Low back pain, unspecified: Secondary | ICD-10-CM | POA: Diagnosis not present

## 2022-05-24 DIAGNOSIS — R8290 Unspecified abnormal findings in urine: Secondary | ICD-10-CM | POA: Diagnosis not present

## 2022-05-24 DIAGNOSIS — R262 Difficulty in walking, not elsewhere classified: Secondary | ICD-10-CM | POA: Diagnosis not present

## 2022-05-24 DIAGNOSIS — M7061 Trochanteric bursitis, right hip: Secondary | ICD-10-CM | POA: Diagnosis not present

## 2022-05-24 DIAGNOSIS — K219 Gastro-esophageal reflux disease without esophagitis: Secondary | ICD-10-CM | POA: Diagnosis not present

## 2022-05-24 DIAGNOSIS — Z6836 Body mass index (BMI) 36.0-36.9, adult: Secondary | ICD-10-CM | POA: Diagnosis not present

## 2022-05-24 DIAGNOSIS — I10 Essential (primary) hypertension: Secondary | ICD-10-CM | POA: Diagnosis not present

## 2022-05-31 DIAGNOSIS — Z9181 History of falling: Secondary | ICD-10-CM | POA: Diagnosis not present

## 2022-05-31 DIAGNOSIS — M25551 Pain in right hip: Secondary | ICD-10-CM | POA: Diagnosis not present

## 2022-05-31 DIAGNOSIS — E663 Overweight: Secondary | ICD-10-CM | POA: Diagnosis not present

## 2022-05-31 DIAGNOSIS — M109 Gout, unspecified: Secondary | ICD-10-CM | POA: Diagnosis not present

## 2022-05-31 DIAGNOSIS — I10 Essential (primary) hypertension: Secondary | ICD-10-CM | POA: Diagnosis not present

## 2022-05-31 DIAGNOSIS — Z6836 Body mass index (BMI) 36.0-36.9, adult: Secondary | ICD-10-CM | POA: Diagnosis not present

## 2022-05-31 DIAGNOSIS — Z0000 Encounter for general adult medical examination without abnormal findings: Secondary | ICD-10-CM | POA: Diagnosis not present

## 2022-07-13 DIAGNOSIS — M25551 Pain in right hip: Secondary | ICD-10-CM | POA: Diagnosis not present

## 2022-07-13 DIAGNOSIS — M8580 Other specified disorders of bone density and structure, unspecified site: Secondary | ICD-10-CM | POA: Diagnosis not present

## 2022-07-13 DIAGNOSIS — I10 Essential (primary) hypertension: Secondary | ICD-10-CM | POA: Diagnosis not present

## 2022-07-13 DIAGNOSIS — G8929 Other chronic pain: Secondary | ICD-10-CM | POA: Diagnosis not present

## 2022-07-13 DIAGNOSIS — M109 Gout, unspecified: Secondary | ICD-10-CM | POA: Diagnosis not present

## 2022-07-13 DIAGNOSIS — F0150 Vascular dementia without behavioral disturbance: Secondary | ICD-10-CM | POA: Diagnosis not present

## 2022-07-13 DIAGNOSIS — M159 Polyosteoarthritis, unspecified: Secondary | ICD-10-CM | POA: Diagnosis not present

## 2022-07-14 DIAGNOSIS — G8929 Other chronic pain: Secondary | ICD-10-CM | POA: Diagnosis not present

## 2022-07-14 DIAGNOSIS — M48062 Spinal stenosis, lumbar region with neurogenic claudication: Secondary | ICD-10-CM | POA: Diagnosis not present

## 2022-07-14 DIAGNOSIS — M5442 Lumbago with sciatica, left side: Secondary | ICD-10-CM | POA: Diagnosis not present

## 2022-07-14 DIAGNOSIS — M5441 Lumbago with sciatica, right side: Secondary | ICD-10-CM | POA: Diagnosis not present

## 2022-08-14 IMAGING — CR DG LUMBAR SPINE 2-3V
1 series · 3 of 3 positions shown · non-contrast
Comparison: Abdomen and pelvis CT dated 09/28/2018

CLINICAL DATA: Acute onset low back pain this morning.

EXAM:
LUMBAR SPINE - 2-3 VIEW

[Series 1: dg lumbar spine 2-3 views · 0.14mm/px · 3 of 3 slices shown]
[im 1/3]
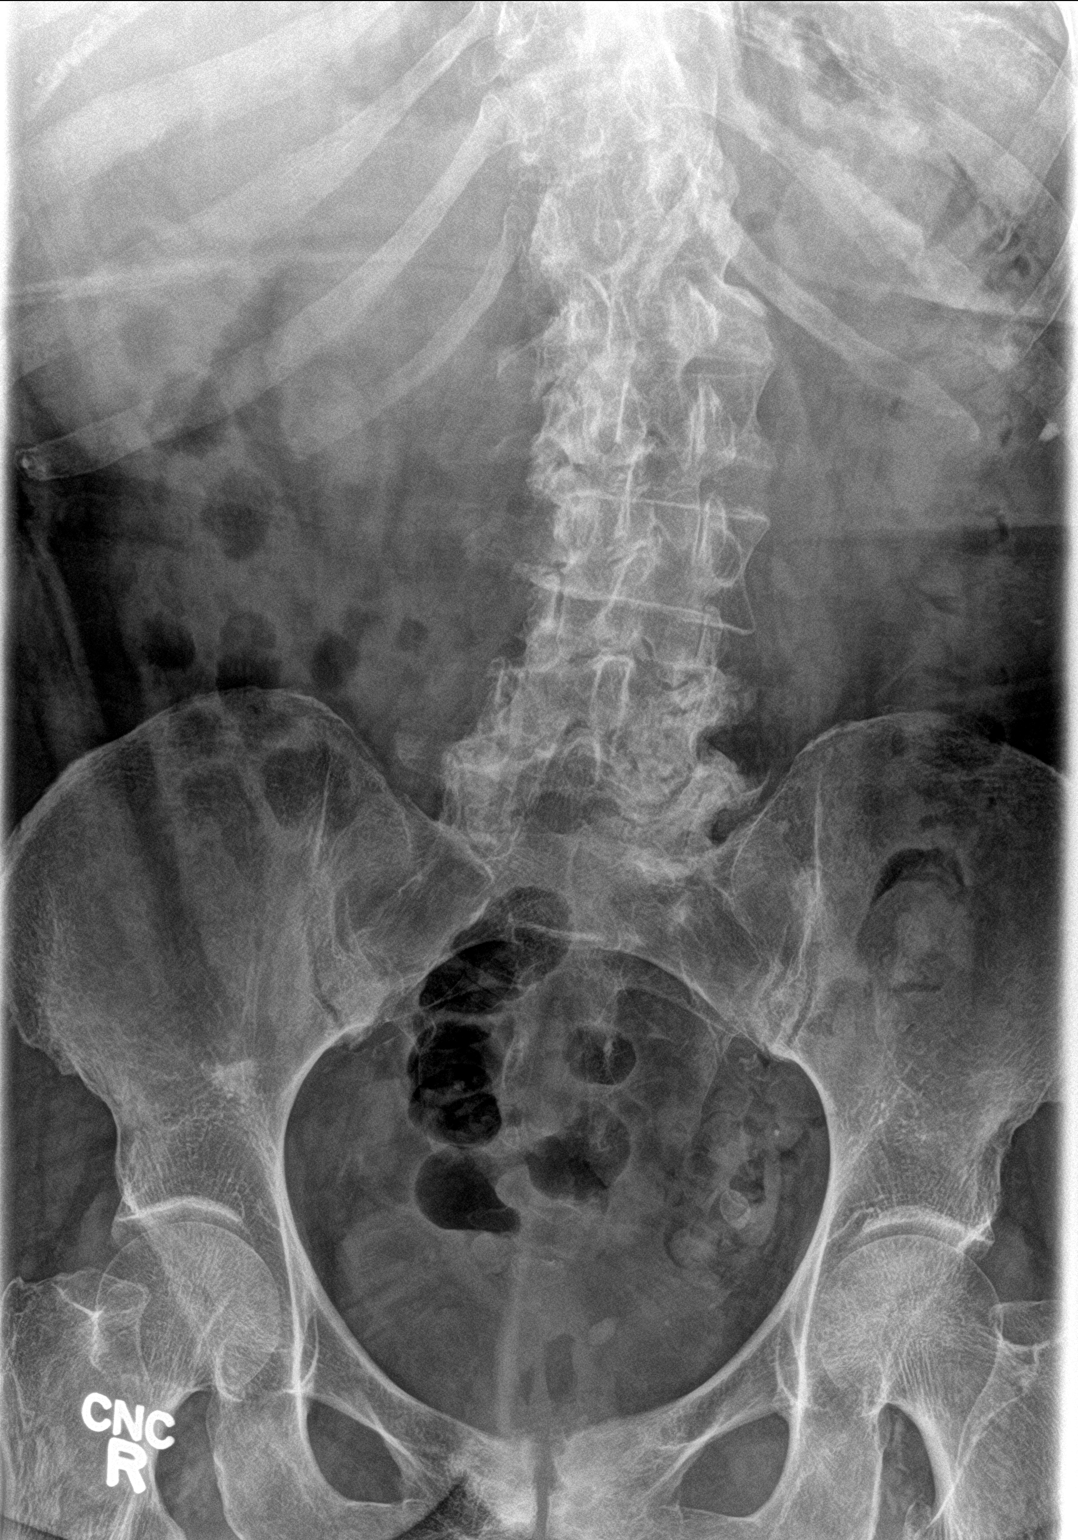
[im 2/3]
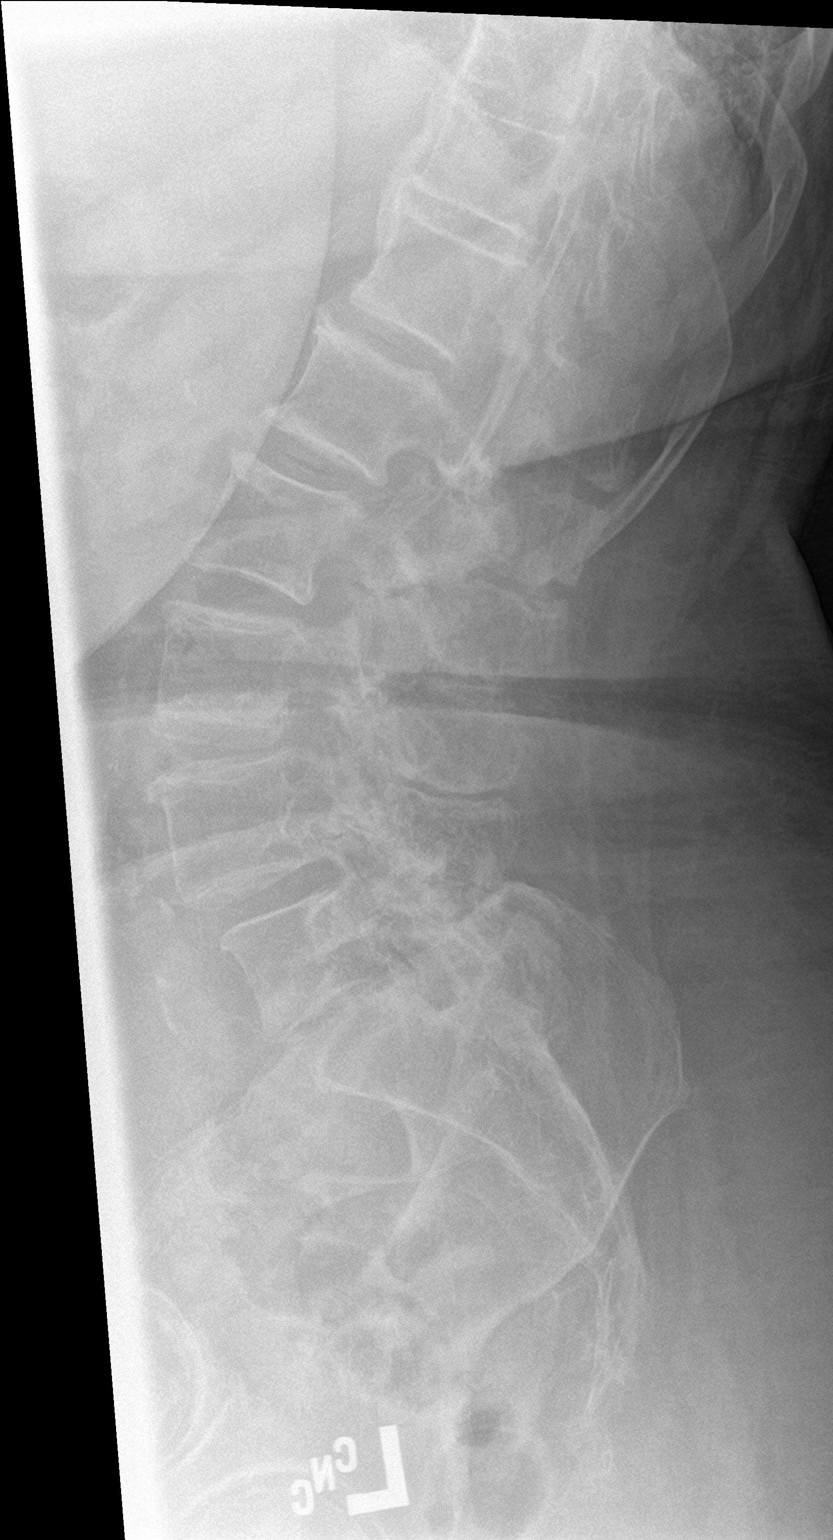
[im 3/3]
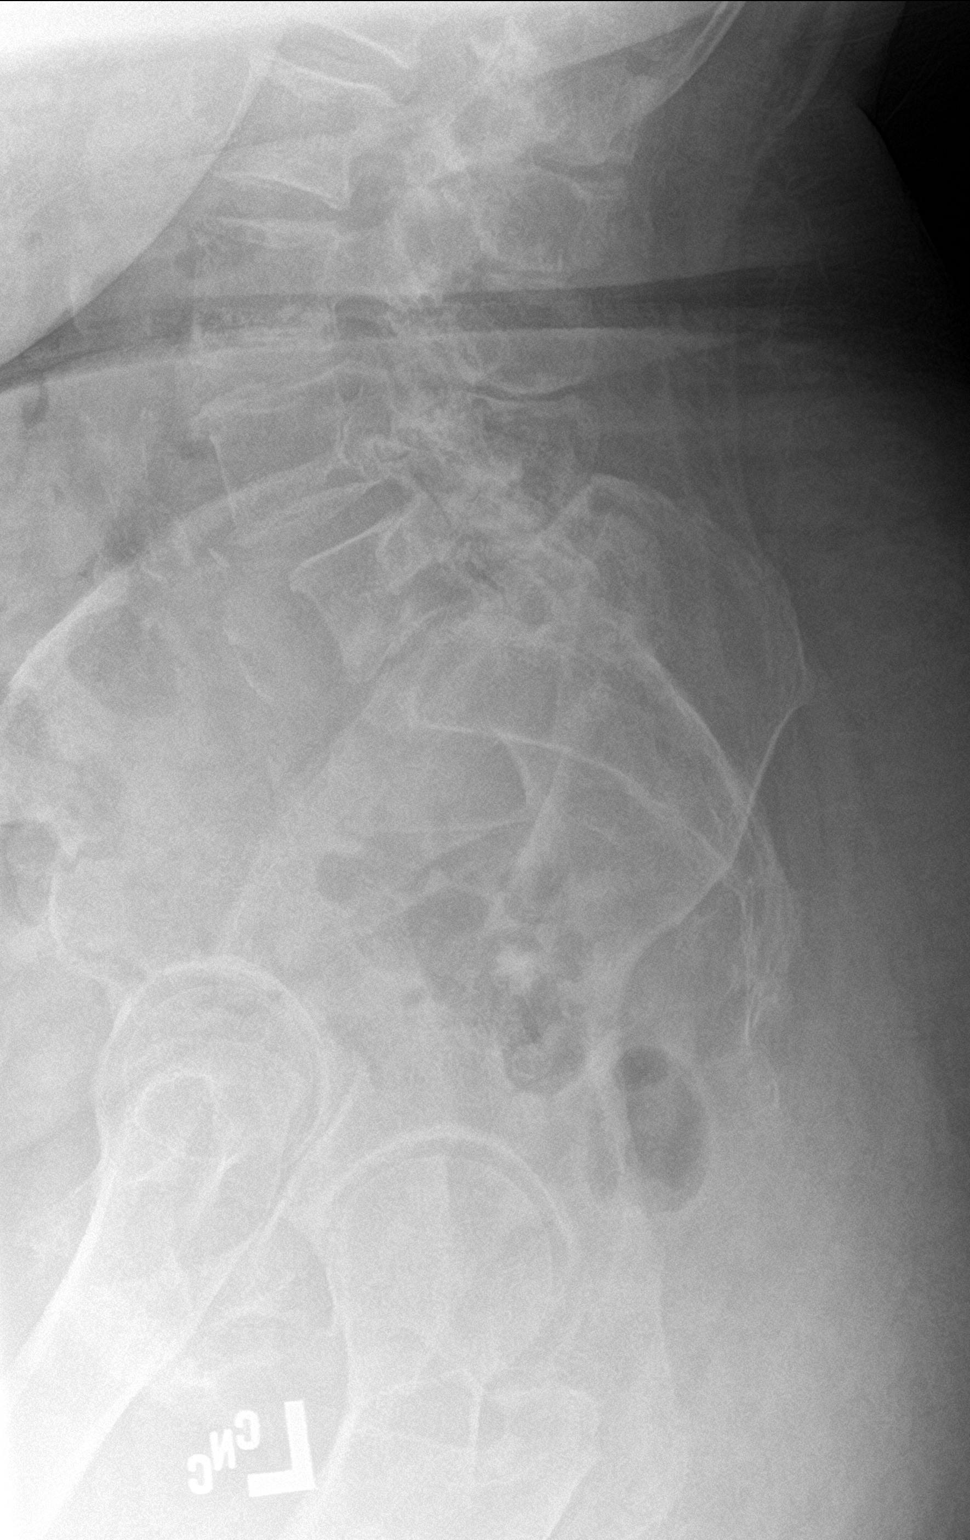

[3 of 3 positions shown; findings below may reference images not displayed]

FINDINGS: Five non-rib-bearing lumbar vertebrae. Stable mild to moderate
levoconvex thoracolumbar rotary scoliosis. Stable extensive facet
degenerative changes at multiple levels with associated grade 1
anterolisthesis at the L4-5 level. Mild to moderate anterior spur
formation at multiple levels. No fractures or pars defects. No acute
subluxations.
IMPRESSION: Stable scoliosis and degenerative changes.  No acute abnormality.

## 2022-08-24 DIAGNOSIS — Z9181 History of falling: Secondary | ICD-10-CM | POA: Diagnosis not present

## 2022-08-24 DIAGNOSIS — G8929 Other chronic pain: Secondary | ICD-10-CM | POA: Diagnosis not present

## 2022-08-24 DIAGNOSIS — I10 Essential (primary) hypertension: Secondary | ICD-10-CM | POA: Diagnosis not present

## 2022-08-24 DIAGNOSIS — M25551 Pain in right hip: Secondary | ICD-10-CM | POA: Diagnosis not present

## 2022-08-24 DIAGNOSIS — M19042 Primary osteoarthritis, left hand: Secondary | ICD-10-CM | POA: Diagnosis not present

## 2022-08-24 DIAGNOSIS — M19041 Primary osteoarthritis, right hand: Secondary | ICD-10-CM | POA: Diagnosis not present

## 2022-08-24 DIAGNOSIS — R262 Difficulty in walking, not elsewhere classified: Secondary | ICD-10-CM | POA: Diagnosis not present

## 2022-08-24 DIAGNOSIS — Z0000 Encounter for general adult medical examination without abnormal findings: Secondary | ICD-10-CM | POA: Diagnosis not present

## 2022-08-31 DIAGNOSIS — M545 Low back pain, unspecified: Secondary | ICD-10-CM | POA: Diagnosis not present

## 2022-08-31 DIAGNOSIS — Z6836 Body mass index (BMI) 36.0-36.9, adult: Secondary | ICD-10-CM | POA: Diagnosis not present

## 2022-08-31 DIAGNOSIS — I10 Essential (primary) hypertension: Secondary | ICD-10-CM | POA: Diagnosis not present

## 2022-08-31 DIAGNOSIS — M170 Bilateral primary osteoarthritis of knee: Secondary | ICD-10-CM | POA: Diagnosis not present

## 2022-08-31 DIAGNOSIS — F0150 Vascular dementia without behavioral disturbance: Secondary | ICD-10-CM | POA: Diagnosis not present

## 2022-08-31 DIAGNOSIS — R262 Difficulty in walking, not elsewhere classified: Secondary | ICD-10-CM | POA: Diagnosis not present

## 2022-10-12 DIAGNOSIS — M109 Gout, unspecified: Secondary | ICD-10-CM | POA: Diagnosis not present

## 2022-10-12 DIAGNOSIS — H548 Legal blindness, as defined in USA: Secondary | ICD-10-CM | POA: Diagnosis not present

## 2022-10-12 DIAGNOSIS — M150 Primary generalized (osteo)arthritis: Secondary | ICD-10-CM | POA: Diagnosis not present

## 2022-10-12 DIAGNOSIS — K219 Gastro-esophageal reflux disease without esophagitis: Secondary | ICD-10-CM | POA: Diagnosis not present

## 2022-10-12 DIAGNOSIS — Z9181 History of falling: Secondary | ICD-10-CM | POA: Diagnosis not present

## 2022-10-12 DIAGNOSIS — D649 Anemia, unspecified: Secondary | ICD-10-CM | POA: Diagnosis not present

## 2022-10-12 DIAGNOSIS — I10 Essential (primary) hypertension: Secondary | ICD-10-CM | POA: Diagnosis not present

## 2022-10-12 DIAGNOSIS — M5431 Sciatica, right side: Secondary | ICD-10-CM | POA: Diagnosis not present

## 2022-10-17 DIAGNOSIS — D649 Anemia, unspecified: Secondary | ICD-10-CM | POA: Diagnosis not present

## 2022-10-17 DIAGNOSIS — H548 Legal blindness, as defined in USA: Secondary | ICD-10-CM | POA: Diagnosis not present

## 2022-10-17 DIAGNOSIS — Z9181 History of falling: Secondary | ICD-10-CM | POA: Diagnosis not present

## 2022-10-17 DIAGNOSIS — M109 Gout, unspecified: Secondary | ICD-10-CM | POA: Diagnosis not present

## 2022-10-17 DIAGNOSIS — M5431 Sciatica, right side: Secondary | ICD-10-CM | POA: Diagnosis not present

## 2022-10-17 DIAGNOSIS — K219 Gastro-esophageal reflux disease without esophagitis: Secondary | ICD-10-CM | POA: Diagnosis not present

## 2022-10-17 DIAGNOSIS — I10 Essential (primary) hypertension: Secondary | ICD-10-CM | POA: Diagnosis not present

## 2022-10-17 DIAGNOSIS — M150 Primary generalized (osteo)arthritis: Secondary | ICD-10-CM | POA: Diagnosis not present

## 2022-10-19 DIAGNOSIS — D649 Anemia, unspecified: Secondary | ICD-10-CM | POA: Diagnosis not present

## 2022-10-19 DIAGNOSIS — Z9181 History of falling: Secondary | ICD-10-CM | POA: Diagnosis not present

## 2022-10-19 DIAGNOSIS — M109 Gout, unspecified: Secondary | ICD-10-CM | POA: Diagnosis not present

## 2022-10-19 DIAGNOSIS — I10 Essential (primary) hypertension: Secondary | ICD-10-CM | POA: Diagnosis not present

## 2022-10-19 DIAGNOSIS — M5431 Sciatica, right side: Secondary | ICD-10-CM | POA: Diagnosis not present

## 2022-10-19 DIAGNOSIS — K219 Gastro-esophageal reflux disease without esophagitis: Secondary | ICD-10-CM | POA: Diagnosis not present

## 2022-10-19 DIAGNOSIS — M150 Primary generalized (osteo)arthritis: Secondary | ICD-10-CM | POA: Diagnosis not present

## 2022-10-19 DIAGNOSIS — H548 Legal blindness, as defined in USA: Secondary | ICD-10-CM | POA: Diagnosis not present

## 2022-10-21 DIAGNOSIS — D649 Anemia, unspecified: Secondary | ICD-10-CM | POA: Diagnosis not present

## 2022-10-21 DIAGNOSIS — M150 Primary generalized (osteo)arthritis: Secondary | ICD-10-CM | POA: Diagnosis not present

## 2022-10-21 DIAGNOSIS — K219 Gastro-esophageal reflux disease without esophagitis: Secondary | ICD-10-CM | POA: Diagnosis not present

## 2022-10-21 DIAGNOSIS — Z9181 History of falling: Secondary | ICD-10-CM | POA: Diagnosis not present

## 2022-10-21 DIAGNOSIS — H548 Legal blindness, as defined in USA: Secondary | ICD-10-CM | POA: Diagnosis not present

## 2022-10-21 DIAGNOSIS — M5431 Sciatica, right side: Secondary | ICD-10-CM | POA: Diagnosis not present

## 2022-10-21 DIAGNOSIS — M109 Gout, unspecified: Secondary | ICD-10-CM | POA: Diagnosis not present

## 2022-10-21 DIAGNOSIS — I10 Essential (primary) hypertension: Secondary | ICD-10-CM | POA: Diagnosis not present

## 2022-10-24 DIAGNOSIS — M5431 Sciatica, right side: Secondary | ICD-10-CM | POA: Diagnosis not present

## 2022-10-24 DIAGNOSIS — M150 Primary generalized (osteo)arthritis: Secondary | ICD-10-CM | POA: Diagnosis not present

## 2022-10-24 DIAGNOSIS — K219 Gastro-esophageal reflux disease without esophagitis: Secondary | ICD-10-CM | POA: Diagnosis not present

## 2022-10-24 DIAGNOSIS — H548 Legal blindness, as defined in USA: Secondary | ICD-10-CM | POA: Diagnosis not present

## 2022-10-24 DIAGNOSIS — I10 Essential (primary) hypertension: Secondary | ICD-10-CM | POA: Diagnosis not present

## 2022-10-24 DIAGNOSIS — D649 Anemia, unspecified: Secondary | ICD-10-CM | POA: Diagnosis not present

## 2022-10-24 DIAGNOSIS — M109 Gout, unspecified: Secondary | ICD-10-CM | POA: Diagnosis not present

## 2022-10-24 DIAGNOSIS — Z9181 History of falling: Secondary | ICD-10-CM | POA: Diagnosis not present

## 2022-10-26 DIAGNOSIS — I10 Essential (primary) hypertension: Secondary | ICD-10-CM | POA: Diagnosis not present

## 2022-10-26 DIAGNOSIS — Z9181 History of falling: Secondary | ICD-10-CM | POA: Diagnosis not present

## 2022-10-26 DIAGNOSIS — H548 Legal blindness, as defined in USA: Secondary | ICD-10-CM | POA: Diagnosis not present

## 2022-10-26 DIAGNOSIS — M150 Primary generalized (osteo)arthritis: Secondary | ICD-10-CM | POA: Diagnosis not present

## 2022-10-26 DIAGNOSIS — M109 Gout, unspecified: Secondary | ICD-10-CM | POA: Diagnosis not present

## 2022-10-26 DIAGNOSIS — K219 Gastro-esophageal reflux disease without esophagitis: Secondary | ICD-10-CM | POA: Diagnosis not present

## 2022-10-26 DIAGNOSIS — M5431 Sciatica, right side: Secondary | ICD-10-CM | POA: Diagnosis not present

## 2022-10-26 DIAGNOSIS — D649 Anemia, unspecified: Secondary | ICD-10-CM | POA: Diagnosis not present

## 2022-10-27 DIAGNOSIS — K219 Gastro-esophageal reflux disease without esophagitis: Secondary | ICD-10-CM | POA: Diagnosis not present

## 2022-10-27 DIAGNOSIS — H548 Legal blindness, as defined in USA: Secondary | ICD-10-CM | POA: Diagnosis not present

## 2022-10-27 DIAGNOSIS — M109 Gout, unspecified: Secondary | ICD-10-CM | POA: Diagnosis not present

## 2022-10-27 DIAGNOSIS — I10 Essential (primary) hypertension: Secondary | ICD-10-CM | POA: Diagnosis not present

## 2022-10-27 DIAGNOSIS — D649 Anemia, unspecified: Secondary | ICD-10-CM | POA: Diagnosis not present

## 2022-10-27 DIAGNOSIS — M150 Primary generalized (osteo)arthritis: Secondary | ICD-10-CM | POA: Diagnosis not present

## 2022-10-27 DIAGNOSIS — M5431 Sciatica, right side: Secondary | ICD-10-CM | POA: Diagnosis not present

## 2022-10-31 DIAGNOSIS — M109 Gout, unspecified: Secondary | ICD-10-CM | POA: Diagnosis not present

## 2022-10-31 DIAGNOSIS — D649 Anemia, unspecified: Secondary | ICD-10-CM | POA: Diagnosis not present

## 2022-10-31 DIAGNOSIS — K219 Gastro-esophageal reflux disease without esophagitis: Secondary | ICD-10-CM | POA: Diagnosis not present

## 2022-10-31 DIAGNOSIS — H548 Legal blindness, as defined in USA: Secondary | ICD-10-CM | POA: Diagnosis not present

## 2022-10-31 DIAGNOSIS — I10 Essential (primary) hypertension: Secondary | ICD-10-CM | POA: Diagnosis not present

## 2022-10-31 DIAGNOSIS — Z9181 History of falling: Secondary | ICD-10-CM | POA: Diagnosis not present

## 2022-10-31 DIAGNOSIS — M150 Primary generalized (osteo)arthritis: Secondary | ICD-10-CM | POA: Diagnosis not present

## 2022-10-31 DIAGNOSIS — M5431 Sciatica, right side: Secondary | ICD-10-CM | POA: Diagnosis not present

## 2022-11-02 DIAGNOSIS — M5431 Sciatica, right side: Secondary | ICD-10-CM | POA: Diagnosis not present

## 2022-11-02 DIAGNOSIS — D649 Anemia, unspecified: Secondary | ICD-10-CM | POA: Diagnosis not present

## 2022-11-02 DIAGNOSIS — M109 Gout, unspecified: Secondary | ICD-10-CM | POA: Diagnosis not present

## 2022-11-02 DIAGNOSIS — M150 Primary generalized (osteo)arthritis: Secondary | ICD-10-CM | POA: Diagnosis not present

## 2022-11-02 DIAGNOSIS — H548 Legal blindness, as defined in USA: Secondary | ICD-10-CM | POA: Diagnosis not present

## 2022-11-02 DIAGNOSIS — I10 Essential (primary) hypertension: Secondary | ICD-10-CM | POA: Diagnosis not present

## 2022-11-02 DIAGNOSIS — Z9181 History of falling: Secondary | ICD-10-CM | POA: Diagnosis not present

## 2022-11-02 DIAGNOSIS — K219 Gastro-esophageal reflux disease without esophagitis: Secondary | ICD-10-CM | POA: Diagnosis not present

## 2022-12-29 DIAGNOSIS — Z6836 Body mass index (BMI) 36.0-36.9, adult: Secondary | ICD-10-CM | POA: Diagnosis not present

## 2022-12-29 DIAGNOSIS — R262 Difficulty in walking, not elsewhere classified: Secondary | ICD-10-CM | POA: Diagnosis not present

## 2022-12-29 DIAGNOSIS — I10 Essential (primary) hypertension: Secondary | ICD-10-CM | POA: Diagnosis not present

## 2022-12-29 DIAGNOSIS — D649 Anemia, unspecified: Secondary | ICD-10-CM | POA: Diagnosis not present

## 2022-12-29 DIAGNOSIS — M5431 Sciatica, right side: Secondary | ICD-10-CM | POA: Diagnosis not present

## 2022-12-29 DIAGNOSIS — M159 Polyosteoarthritis, unspecified: Secondary | ICD-10-CM | POA: Diagnosis not present

## 2023-01-05 DIAGNOSIS — F0150 Vascular dementia without behavioral disturbance: Secondary | ICD-10-CM | POA: Diagnosis not present

## 2023-01-05 DIAGNOSIS — Z0000 Encounter for general adult medical examination without abnormal findings: Secondary | ICD-10-CM | POA: Diagnosis not present

## 2023-01-05 DIAGNOSIS — I10 Essential (primary) hypertension: Secondary | ICD-10-CM | POA: Diagnosis not present

## 2023-01-05 DIAGNOSIS — Z8739 Personal history of other diseases of the musculoskeletal system and connective tissue: Secondary | ICD-10-CM | POA: Diagnosis not present

## 2023-01-05 DIAGNOSIS — R262 Difficulty in walking, not elsewhere classified: Secondary | ICD-10-CM | POA: Diagnosis not present

## 2023-03-31 DIAGNOSIS — Z8739 Personal history of other diseases of the musculoskeletal system and connective tissue: Secondary | ICD-10-CM | POA: Diagnosis not present

## 2023-03-31 DIAGNOSIS — M160 Bilateral primary osteoarthritis of hip: Secondary | ICD-10-CM | POA: Diagnosis not present

## 2023-03-31 DIAGNOSIS — I10 Essential (primary) hypertension: Secondary | ICD-10-CM | POA: Diagnosis not present

## 2023-03-31 DIAGNOSIS — R262 Difficulty in walking, not elsewhere classified: Secondary | ICD-10-CM | POA: Diagnosis not present

## 2023-03-31 DIAGNOSIS — Z6835 Body mass index (BMI) 35.0-35.9, adult: Secondary | ICD-10-CM | POA: Diagnosis not present

## 2023-04-07 DIAGNOSIS — R262 Difficulty in walking, not elsewhere classified: Secondary | ICD-10-CM | POA: Diagnosis not present

## 2023-04-07 DIAGNOSIS — Z780 Asymptomatic menopausal state: Secondary | ICD-10-CM | POA: Diagnosis not present

## 2023-04-07 DIAGNOSIS — Z13820 Encounter for screening for osteoporosis: Secondary | ICD-10-CM | POA: Diagnosis not present

## 2023-04-07 DIAGNOSIS — M545 Low back pain, unspecified: Secondary | ICD-10-CM | POA: Diagnosis not present

## 2023-04-07 DIAGNOSIS — F0150 Vascular dementia without behavioral disturbance: Secondary | ICD-10-CM | POA: Diagnosis not present

## 2023-04-07 DIAGNOSIS — H547 Unspecified visual loss: Secondary | ICD-10-CM | POA: Diagnosis not present

## 2023-04-07 DIAGNOSIS — Z8739 Personal history of other diseases of the musculoskeletal system and connective tissue: Secondary | ICD-10-CM | POA: Diagnosis not present

## 2023-04-07 DIAGNOSIS — I10 Essential (primary) hypertension: Secondary | ICD-10-CM | POA: Diagnosis not present

## 2023-04-07 DIAGNOSIS — L989 Disorder of the skin and subcutaneous tissue, unspecified: Secondary | ICD-10-CM | POA: Diagnosis not present

## 2023-05-15 DIAGNOSIS — U071 COVID-19: Secondary | ICD-10-CM | POA: Diagnosis not present

## 2023-05-15 DIAGNOSIS — Z20822 Contact with and (suspected) exposure to covid-19: Secondary | ICD-10-CM | POA: Diagnosis not present

## 2023-05-25 DIAGNOSIS — M8588 Other specified disorders of bone density and structure, other site: Secondary | ICD-10-CM | POA: Diagnosis not present

## 2023-08-07 DIAGNOSIS — H5213 Myopia, bilateral: Secondary | ICD-10-CM | POA: Diagnosis not present

## 2023-08-17 DIAGNOSIS — M5442 Lumbago with sciatica, left side: Secondary | ICD-10-CM | POA: Diagnosis not present

## 2023-08-17 DIAGNOSIS — I10 Essential (primary) hypertension: Secondary | ICD-10-CM | POA: Diagnosis not present

## 2023-08-17 DIAGNOSIS — H547 Unspecified visual loss: Secondary | ICD-10-CM | POA: Diagnosis not present

## 2023-08-17 DIAGNOSIS — M5441 Lumbago with sciatica, right side: Secondary | ICD-10-CM | POA: Diagnosis not present

## 2023-08-17 DIAGNOSIS — Z8739 Personal history of other diseases of the musculoskeletal system and connective tissue: Secondary | ICD-10-CM | POA: Diagnosis not present

## 2023-08-17 DIAGNOSIS — M150 Primary generalized (osteo)arthritis: Secondary | ICD-10-CM | POA: Diagnosis not present

## 2023-08-17 DIAGNOSIS — Z6834 Body mass index (BMI) 34.0-34.9, adult: Secondary | ICD-10-CM | POA: Diagnosis not present

## 2023-08-17 DIAGNOSIS — R262 Difficulty in walking, not elsewhere classified: Secondary | ICD-10-CM | POA: Diagnosis not present

## 2023-08-17 DIAGNOSIS — Z13820 Encounter for screening for osteoporosis: Secondary | ICD-10-CM | POA: Diagnosis not present

## 2023-08-23 DIAGNOSIS — H5203 Hypermetropia, bilateral: Secondary | ICD-10-CM | POA: Diagnosis not present

## 2023-08-24 DIAGNOSIS — Z1331 Encounter for screening for depression: Secondary | ICD-10-CM | POA: Diagnosis not present

## 2023-08-24 DIAGNOSIS — M545 Low back pain, unspecified: Secondary | ICD-10-CM | POA: Diagnosis not present

## 2023-08-24 DIAGNOSIS — Z0000 Encounter for general adult medical examination without abnormal findings: Secondary | ICD-10-CM | POA: Diagnosis not present

## 2023-08-24 DIAGNOSIS — Z6836 Body mass index (BMI) 36.0-36.9, adult: Secondary | ICD-10-CM | POA: Diagnosis not present

## 2023-08-24 DIAGNOSIS — L299 Pruritus, unspecified: Secondary | ICD-10-CM | POA: Diagnosis not present

## 2023-08-24 DIAGNOSIS — L84 Corns and callosities: Secondary | ICD-10-CM | POA: Diagnosis not present

## 2023-08-24 DIAGNOSIS — I10 Essential (primary) hypertension: Secondary | ICD-10-CM | POA: Diagnosis not present

## 2023-08-24 DIAGNOSIS — Z23 Encounter for immunization: Secondary | ICD-10-CM | POA: Diagnosis not present

## 2023-12-18 DIAGNOSIS — G8929 Other chronic pain: Secondary | ICD-10-CM | POA: Diagnosis not present

## 2023-12-18 DIAGNOSIS — Z0000 Encounter for general adult medical examination without abnormal findings: Secondary | ICD-10-CM | POA: Diagnosis not present

## 2023-12-18 DIAGNOSIS — L84 Corns and callosities: Secondary | ICD-10-CM | POA: Diagnosis not present

## 2023-12-18 DIAGNOSIS — M545 Low back pain, unspecified: Secondary | ICD-10-CM | POA: Diagnosis not present

## 2023-12-18 DIAGNOSIS — Z6836 Body mass index (BMI) 36.0-36.9, adult: Secondary | ICD-10-CM | POA: Diagnosis not present

## 2023-12-18 DIAGNOSIS — R262 Difficulty in walking, not elsewhere classified: Secondary | ICD-10-CM | POA: Diagnosis not present

## 2023-12-18 DIAGNOSIS — I10 Essential (primary) hypertension: Secondary | ICD-10-CM | POA: Diagnosis not present

## 2023-12-18 DIAGNOSIS — L299 Pruritus, unspecified: Secondary | ICD-10-CM | POA: Diagnosis not present

## 2023-12-25 DIAGNOSIS — I10 Essential (primary) hypertension: Secondary | ICD-10-CM | POA: Diagnosis not present

## 2023-12-25 DIAGNOSIS — Z0000 Encounter for general adult medical examination without abnormal findings: Secondary | ICD-10-CM | POA: Diagnosis not present

## 2023-12-25 DIAGNOSIS — Z8739 Personal history of other diseases of the musculoskeletal system and connective tissue: Secondary | ICD-10-CM | POA: Diagnosis not present

## 2023-12-25 DIAGNOSIS — M159 Polyosteoarthritis, unspecified: Secondary | ICD-10-CM | POA: Diagnosis not present

## 2023-12-25 DIAGNOSIS — F0150 Vascular dementia without behavioral disturbance: Secondary | ICD-10-CM | POA: Diagnosis not present

## 2024-04-22 DIAGNOSIS — R262 Difficulty in walking, not elsewhere classified: Secondary | ICD-10-CM | POA: Diagnosis not present

## 2024-04-22 DIAGNOSIS — Z8739 Personal history of other diseases of the musculoskeletal system and connective tissue: Secondary | ICD-10-CM | POA: Diagnosis not present

## 2024-04-22 DIAGNOSIS — F01B0 Vascular dementia, moderate, without behavioral disturbance, psychotic disturbance, mood disturbance, and anxiety: Secondary | ICD-10-CM | POA: Diagnosis not present

## 2024-04-22 DIAGNOSIS — Z6835 Body mass index (BMI) 35.0-35.9, adult: Secondary | ICD-10-CM | POA: Diagnosis not present

## 2024-04-22 DIAGNOSIS — M159 Polyosteoarthritis, unspecified: Secondary | ICD-10-CM | POA: Diagnosis not present

## 2024-04-22 DIAGNOSIS — I10 Essential (primary) hypertension: Secondary | ICD-10-CM | POA: Diagnosis not present

## 2024-04-29 DIAGNOSIS — M25551 Pain in right hip: Secondary | ICD-10-CM | POA: Diagnosis not present

## 2024-04-29 DIAGNOSIS — R262 Difficulty in walking, not elsewhere classified: Secondary | ICD-10-CM | POA: Diagnosis not present

## 2024-04-29 DIAGNOSIS — M159 Polyosteoarthritis, unspecified: Secondary | ICD-10-CM | POA: Diagnosis not present

## 2024-04-29 DIAGNOSIS — Z8739 Personal history of other diseases of the musculoskeletal system and connective tissue: Secondary | ICD-10-CM | POA: Diagnosis not present

## 2024-04-29 DIAGNOSIS — I10 Essential (primary) hypertension: Secondary | ICD-10-CM | POA: Diagnosis not present

## 2024-04-29 DIAGNOSIS — G8929 Other chronic pain: Secondary | ICD-10-CM | POA: Diagnosis not present

## 2024-04-29 DIAGNOSIS — M8580 Other specified disorders of bone density and structure, unspecified site: Secondary | ICD-10-CM | POA: Diagnosis not present

## 2024-04-29 DIAGNOSIS — L84 Corns and callosities: Secondary | ICD-10-CM | POA: Diagnosis not present
